# Patient Record
Sex: Female | Born: 1990 | Race: Black or African American | Hispanic: No | Marital: Single | Smoking: Light tobacco smoker
Health system: Southern US, Community
[De-identification: ages and names within clinical notes are randomized; demographics above are authoritative.]

## PROBLEM LIST (undated history)

## (undated) ENCOUNTER — Inpatient Hospital Stay: Payer: Self-pay

## (undated) DIAGNOSIS — O4100X0 Oligohydramnios, unspecified trimester, not applicable or unspecified: Secondary | ICD-10-CM

## (undated) DIAGNOSIS — D649 Anemia, unspecified: Secondary | ICD-10-CM

## (undated) DIAGNOSIS — N390 Urinary tract infection, site not specified: Secondary | ICD-10-CM

## (undated) DIAGNOSIS — D573 Sickle-cell trait: Secondary | ICD-10-CM

## (undated) DIAGNOSIS — K8020 Calculus of gallbladder without cholecystitis without obstruction: Secondary | ICD-10-CM

## (undated) DIAGNOSIS — R102 Pelvic and perineal pain: Secondary | ICD-10-CM

---

## 2013-07-12 ENCOUNTER — Emergency Department: Payer: Self-pay | Admitting: Emergency Medicine

## 2013-07-12 LAB — COMPREHENSIVE METABOLIC PANEL
Albumin: 3.8 g/dL (ref 3.4–5.0)
Alkaline Phosphatase: 51 U/L (ref 50–136)
Anion Gap: 4 — ABNORMAL LOW (ref 7–16)
BUN: 9 mg/dL (ref 7–18)
Bilirubin,Total: 0.2 mg/dL (ref 0.2–1.0)
Calcium, Total: 9.2 mg/dL (ref 8.5–10.1)
Chloride: 108 mmol/L — ABNORMAL HIGH (ref 98–107)
Co2: 28 mmol/L (ref 21–32)
EGFR (Non-African Amer.): >60
Osmolality: 277 (ref 275–301)
Potassium: 3.5 mmol/L (ref 3.5–5.1)
SGPT (ALT): 18 U/L (ref 12–78)
Sodium: 140 mmol/L (ref 136–145)
Total Protein: 7.4 g/dL (ref 6.4–8.2)

## 2013-07-12 LAB — CBC WITH DIFFERENTIAL/PLATELET
Basophil #: 0.1 10*3/uL (ref 0.0–0.1)
Basophil %: 1.1 %
Eosinophil #: 0.2 10*3/uL (ref 0.0–0.7)
Eosinophil %: 4.1 %
HCT: 34.1 % — ABNORMAL LOW (ref 35.0–47.0)
Lymphocyte #: 2.2 10*3/uL (ref 1.0–3.6)
MCHC: 34.9 g/dL (ref 32.0–36.0)
MCV: 83 fL (ref 80–100)
Monocyte #: 0.4 x10 3/mm (ref 0.2–0.9)
Neutrophil %: 44.7 %
Platelet: 197 10*3/uL (ref 150–440)
RBC: 4.1 10*6/uL (ref 3.80–5.20)
RDW: 12.8 % (ref 11.5–14.5)
WBC: 5.1 10*3/uL (ref 3.6–11.0)

## 2013-07-12 LAB — URINALYSIS, COMPLETE
Bilirubin,UR: NEGATIVE
Glucose,UR: NEGATIVE mg/dL (ref 0–75)
Ketone: NEGATIVE
Leukocyte Esterase: NEGATIVE
Protein: NEGATIVE
RBC,UR: 1 /HPF (ref 0–5)
Squamous Epithelial: 6
WBC UR: 1 /HPF (ref 0–5)

## 2013-07-12 LAB — WET PREP, GENITAL

## 2013-07-12 LAB — GC/CHLAMYDIA PROBE AMP

## 2013-07-17 ENCOUNTER — Emergency Department: Payer: Self-pay | Admitting: Emergency Medicine

## 2013-07-23 ENCOUNTER — Emergency Department: Payer: Self-pay | Admitting: Emergency Medicine

## 2013-10-23 ENCOUNTER — Emergency Department: Payer: Self-pay | Admitting: Emergency Medicine

## 2013-10-23 LAB — URINALYSIS, COMPLETE
Bacteria: NONE SEEN
Bilirubin,UR: NEGATIVE
Glucose,UR: NEGATIVE mg/dL (ref 0–75)
Ketone: NEGATIVE
Nitrite: NEGATIVE
Ph: 7 (ref 4.5–8.0)
Protein: NEGATIVE
RBC,UR: 1 /HPF (ref 0–5)
Specific Gravity: 1.015 (ref 1.003–1.030)
WBC UR: 16 /HPF (ref 0–5)

## 2013-10-23 LAB — WET PREP, GENITAL

## 2013-11-30 ENCOUNTER — Emergency Department: Payer: Self-pay | Admitting: Emergency Medicine

## 2013-11-30 LAB — URIC ACID: Uric Acid: 3.6 mg/dL (ref 2.6–6.0)

## 2013-12-06 ENCOUNTER — Emergency Department: Payer: Self-pay | Admitting: Emergency Medicine

## 2014-01-31 ENCOUNTER — Emergency Department: Payer: Self-pay | Admitting: Emergency Medicine

## 2014-01-31 LAB — CBC WITH DIFFERENTIAL/PLATELET
BASOS ABS: 0.1 10*3/uL (ref 0.0–0.1)
Basophil %: 1.1 %
EOS ABS: 0.3 10*3/uL (ref 0.0–0.7)
Eosinophil %: 4.2 %
HCT: 33.3 % — ABNORMAL LOW (ref 35.0–47.0)
HGB: 11 g/dL — ABNORMAL LOW (ref 12.0–16.0)
LYMPHS ABS: 2.8 10*3/uL (ref 1.0–3.6)
LYMPHS PCT: 43.6 %
MCH: 27.7 pg (ref 26.0–34.0)
MCHC: 33 g/dL (ref 32.0–36.0)
MCV: 84 fL (ref 80–100)
MONO ABS: 0.4 x10 3/mm (ref 0.2–0.9)
Monocyte %: 5.8 %
NEUTROS ABS: 2.9 10*3/uL (ref 1.4–6.5)
NEUTROS PCT: 45.3 %
PLATELETS: 189 10*3/uL (ref 150–440)
RBC: 3.97 10*6/uL (ref 3.80–5.20)
RDW: 13.6 % (ref 11.5–14.5)
WBC: 6.5 10*3/uL (ref 3.6–11.0)

## 2014-01-31 LAB — URINALYSIS, COMPLETE
BILIRUBIN, UR: NEGATIVE
GLUCOSE, UR: NEGATIVE mg/dL (ref 0–75)
Ketone: NEGATIVE
NITRITE: NEGATIVE
PH: 5 (ref 4.5–8.0)
Protein: NEGATIVE
RBC,UR: 173 /HPF (ref 0–5)
SPECIFIC GRAVITY: 1.016 (ref 1.003–1.030)
Squamous Epithelial: 3
WBC UR: 8 /HPF (ref 0–5)

## 2014-01-31 LAB — BASIC METABOLIC PANEL
Anion Gap: 6 — ABNORMAL LOW (ref 7–16)
BUN: 8 mg/dL (ref 7–18)
CHLORIDE: 108 mmol/L — AB (ref 98–107)
CO2: 25 mmol/L (ref 21–32)
Calcium, Total: 8.8 mg/dL (ref 8.5–10.1)
Creatinine: 0.64 mg/dL (ref 0.60–1.30)
EGFR (African American): >60
GLUCOSE: 117 mg/dL — AB (ref 65–99)
Osmolality: 277 (ref 275–301)
POTASSIUM: 3.2 mmol/L — AB (ref 3.5–5.1)
SODIUM: 139 mmol/L (ref 136–145)

## 2014-01-31 LAB — TROPONIN I: Troponin-I: <0.02 ng/mL

## 2014-02-01 LAB — GC/CHLAMYDIA PROBE AMP

## 2014-02-01 LAB — TSH: Thyroid Stimulating Horm: 1.75 u[IU]/mL

## 2014-02-01 LAB — WET PREP, GENITAL

## 2014-02-24 ENCOUNTER — Emergency Department: Payer: Self-pay | Admitting: Internal Medicine

## 2014-07-26 ENCOUNTER — Emergency Department: Payer: Self-pay | Admitting: Emergency Medicine

## 2014-07-26 LAB — URINALYSIS, COMPLETE
BLOOD: NEGATIVE
Bacteria: NONE SEEN
Bilirubin,UR: NEGATIVE
Glucose,UR: NEGATIVE mg/dL (ref 0–75)
KETONE: NEGATIVE
Nitrite: NEGATIVE
Ph: 5 (ref 4.5–8.0)
Protein: NEGATIVE
RBC,UR: 8 /HPF (ref 0–5)
Specific Gravity: 1.018 (ref 1.003–1.030)

## 2014-07-26 LAB — COMPREHENSIVE METABOLIC PANEL
ALK PHOS: 42 U/L — AB
ANION GAP: 7 (ref 7–16)
AST: 13 U/L — AB (ref 15–37)
Albumin: 3.7 g/dL (ref 3.4–5.0)
BUN: 7 mg/dL (ref 7–18)
Bilirubin,Total: 0.3 mg/dL (ref 0.2–1.0)
CO2: 25 mmol/L (ref 21–32)
Calcium, Total: 9 mg/dL (ref 8.5–10.1)
Chloride: 109 mmol/L — ABNORMAL HIGH (ref 98–107)
Creatinine: 0.66 mg/dL (ref 0.60–1.30)
EGFR (African American): >60
EGFR (Non-African Amer.): >60
GLUCOSE: 75 mg/dL (ref 65–99)
Osmolality: 278 (ref 275–301)
Potassium: 3.3 mmol/L — ABNORMAL LOW (ref 3.5–5.1)
SGPT (ALT): 17 U/L
Sodium: 141 mmol/L (ref 136–145)
Total Protein: 7.2 g/dL (ref 6.4–8.2)

## 2014-07-26 LAB — CBC WITH DIFFERENTIAL/PLATELET
BASOS ABS: 0.1 10*3/uL (ref 0.0–0.1)
BASOS PCT: 1 %
EOS ABS: 0.2 10*3/uL (ref 0.0–0.7)
EOS PCT: 3.4 %
HCT: 33.7 % — ABNORMAL LOW (ref 35.0–47.0)
HGB: 11.5 g/dL — AB (ref 12.0–16.0)
LYMPHS ABS: 2.3 10*3/uL (ref 1.0–3.6)
Lymphocyte %: 39.5 %
MCH: 28.8 pg (ref 26.0–34.0)
MCHC: 34.1 g/dL (ref 32.0–36.0)
MCV: 85 fL (ref 80–100)
MONO ABS: 0.3 x10 3/mm (ref 0.2–0.9)
MONOS PCT: 5.5 %
NEUTROS ABS: 2.9 10*3/uL (ref 1.4–6.5)
NEUTROS PCT: 50.6 %
Platelet: 189 10*3/uL (ref 150–440)
RBC: 3.98 10*6/uL (ref 3.80–5.20)
RDW: 13.3 % (ref 11.5–14.5)
WBC: 5.8 10*3/uL (ref 3.6–11.0)

## 2014-07-26 LAB — PREGNANCY, URINE: Pregnancy Test, Urine: NEGATIVE m[IU]/mL

## 2014-07-26 LAB — LIPASE, BLOOD: LIPASE: 98 U/L (ref 73–393)

## 2014-07-26 LAB — WET PREP, GENITAL

## 2014-07-26 LAB — GC/CHLAMYDIA PROBE AMP

## 2014-07-28 LAB — URINE CULTURE

## 2014-10-06 ENCOUNTER — Emergency Department: Payer: Self-pay | Admitting: Emergency Medicine

## 2014-10-18 ENCOUNTER — Emergency Department: Payer: Self-pay | Admitting: Emergency Medicine

## 2014-10-18 LAB — CBC WITH DIFFERENTIAL/PLATELET
BASOS ABS: 0 10*3/uL (ref 0.0–0.1)
Basophil %: 0.2 %
Eosinophil #: 0.1 10*3/uL (ref 0.0–0.7)
Eosinophil %: 0.4 %
HCT: 32.3 % — AB (ref 35.0–47.0)
HGB: 10.8 g/dL — ABNORMAL LOW (ref 12.0–16.0)
LYMPHS PCT: 10.8 %
Lymphocyte #: 1.2 10*3/uL (ref 1.0–3.6)
MCH: 28.3 pg (ref 26.0–34.0)
MCHC: 33.4 g/dL (ref 32.0–36.0)
MCV: 85 fL (ref 80–100)
Monocyte #: 0.8 x10 3/mm (ref 0.2–0.9)
Monocyte %: 6.6 %
NEUTROS PCT: 82 %
Neutrophil #: 9.4 10*3/uL — ABNORMAL HIGH (ref 1.4–6.5)
PLATELETS: 198 10*3/uL (ref 150–440)
RBC: 3.81 10*6/uL (ref 3.80–5.20)
RDW: 12.9 % (ref 11.5–14.5)
WBC: 11.5 10*3/uL — ABNORMAL HIGH (ref 3.6–11.0)

## 2014-10-18 LAB — COMPREHENSIVE METABOLIC PANEL
ALBUMIN: 3.4 g/dL (ref 3.4–5.0)
ANION GAP: 9 (ref 7–16)
Alkaline Phosphatase: 66 U/L
BUN: 5 mg/dL — ABNORMAL LOW (ref 7–18)
Bilirubin,Total: 0.6 mg/dL (ref 0.2–1.0)
CALCIUM: 8.5 mg/dL (ref 8.5–10.1)
Chloride: 100 mmol/L (ref 98–107)
Co2: 27 mmol/L (ref 21–32)
Creatinine: 0.73 mg/dL (ref 0.60–1.30)
GLUCOSE: 116 mg/dL — AB (ref 65–99)
OSMOLALITY: 270 (ref 275–301)
Potassium: 3.1 mmol/L — ABNORMAL LOW (ref 3.5–5.1)
SGOT(AST): 27 U/L (ref 15–37)
SGPT (ALT): 38 U/L
SODIUM: 136 mmol/L (ref 136–145)
Total Protein: 7.9 g/dL (ref 6.4–8.2)

## 2014-10-18 LAB — URINALYSIS, COMPLETE
BACTERIA: NONE SEEN
BLOOD: NEGATIVE
Bilirubin,UR: NEGATIVE
Glucose,UR: NEGATIVE mg/dL (ref 0–75)
Nitrite: NEGATIVE
Ph: 5 (ref 4.5–8.0)
RBC,UR: 20 /HPF (ref 0–5)
Specific Gravity: 1.013 (ref 1.003–1.030)
WBC UR: 190 /HPF (ref 0–5)

## 2014-10-18 LAB — GC/CHLAMYDIA PROBE AMP

## 2014-10-18 LAB — WET PREP, GENITAL

## 2014-10-18 LAB — LIPASE, BLOOD: Lipase: 105 U/L (ref 73–393)

## 2014-10-18 LAB — HCG, QUANTITATIVE, PREGNANCY: Beta Hcg, Quant.: <1 m[IU]/mL — ABNORMAL LOW

## 2015-02-02 ENCOUNTER — Emergency Department: Payer: Self-pay | Admitting: Emergency Medicine

## 2015-02-03 ENCOUNTER — Emergency Department: Payer: Self-pay | Admitting: Emergency Medicine

## 2015-04-20 ENCOUNTER — Encounter: Payer: Self-pay | Admitting: *Deleted

## 2015-04-20 ENCOUNTER — Emergency Department
Admission: EM | Admit: 2015-04-20 | Discharge: 2015-04-21 | Disposition: A | Discharge status: Home / Self Care | Payer: Self-pay | Attending: Emergency Medicine | Admitting: Emergency Medicine

## 2015-04-20 ENCOUNTER — Emergency Department: Payer: Self-pay

## 2015-04-20 DIAGNOSIS — F17210 Nicotine dependence, cigarettes, uncomplicated: Secondary | ICD-10-CM | POA: Insufficient documentation

## 2015-04-20 DIAGNOSIS — O9989 Other specified diseases and conditions complicating pregnancy, childbirth and the puerperium: Secondary | ICD-10-CM | POA: Insufficient documentation

## 2015-04-20 DIAGNOSIS — O99331 Smoking (tobacco) complicating pregnancy, first trimester: Secondary | ICD-10-CM | POA: Insufficient documentation

## 2015-04-20 DIAGNOSIS — Z3A01 Less than 8 weeks gestation of pregnancy: Secondary | ICD-10-CM | POA: Insufficient documentation

## 2015-04-20 DIAGNOSIS — Z3491 Encounter for supervision of normal pregnancy, unspecified, first trimester: Secondary | ICD-10-CM

## 2015-04-20 HISTORY — DX: Sickle-cell trait: D57.3

## 2015-04-20 LAB — URINALYSIS COMPLETE WITH MICROSCOPIC (ARMC ONLY)
Bilirubin Urine: NEGATIVE
Glucose, UA: NEGATIVE mg/dL
HGB URINE DIPSTICK: NEGATIVE
Ketones, ur: NEGATIVE mg/dL
Leukocytes, UA: NEGATIVE
NITRITE: NEGATIVE
Protein, ur: NEGATIVE mg/dL
SPECIFIC GRAVITY, URINE: 1.015 (ref 1.005–1.030)
pH: 6 (ref 5.0–8.0)

## 2015-04-20 LAB — COMPREHENSIVE METABOLIC PANEL
ALT: 12 U/L — ABNORMAL LOW (ref 14–54)
ANION GAP: 8 (ref 5–15)
AST: 17 U/L (ref 15–41)
Albumin: 4.4 g/dL (ref 3.5–5.0)
Alkaline Phosphatase: 33 U/L — ABNORMAL LOW (ref 38–126)
BILIRUBIN TOTAL: 0.3 mg/dL (ref 0.3–1.2)
BUN: 7 mg/dL (ref 6–20)
CO2: 23 mmol/L (ref 22–32)
CREATININE: 0.5 mg/dL (ref 0.44–1.00)
Calcium: 9.1 mg/dL (ref 8.9–10.3)
Chloride: 107 mmol/L (ref 101–111)
GFR calc Af Amer: >60 mL/min (ref >60)
GFR calc non Af Amer: >60 mL/min (ref >60)
GLUCOSE: 84 mg/dL (ref 65–99)
Potassium: 3.5 mmol/L (ref 3.5–5.1)
Sodium: 138 mmol/L (ref 135–145)
Total Protein: 7.5 g/dL (ref 6.5–8.1)

## 2015-04-20 LAB — CBC WITH DIFFERENTIAL/PLATELET
BASOS ABS: 0 10*3/uL (ref 0–0.1)
BASOS PCT: 1 %
Eosinophils Absolute: 0.2 10*3/uL (ref 0–0.7)
Eosinophils Relative: 3 %
HCT: 36.7 % (ref 35.0–47.0)
Hemoglobin: 12.3 g/dL (ref 12.0–16.0)
Lymphocytes Relative: 46 %
Lymphs Abs: 2.6 10*3/uL (ref 1.0–3.6)
MCH: 28.8 pg (ref 26.0–34.0)
MCHC: 33.6 g/dL (ref 32.0–36.0)
MCV: 85.7 fL (ref 80.0–100.0)
Monocytes Absolute: 0.3 10*3/uL (ref 0.2–0.9)
Monocytes Relative: 5 %
NEUTROS ABS: 2.5 10*3/uL (ref 1.4–6.5)
Neutrophils Relative %: 45 %
Platelets: 195 10*3/uL (ref 150–440)
RBC: 4.28 MIL/uL (ref 3.80–5.20)
RDW: 12.6 % (ref 11.5–14.5)
WBC: 5.6 10*3/uL (ref 3.6–11.0)

## 2015-04-20 LAB — PREGNANCY, URINE
PREG TEST UR: POSITIVE — AB
PREG TEST UR: POSITIVE — AB

## 2015-04-20 MED ORDER — IOHEXOL 300 MG/ML  SOLN: 100.0000 mL | Freq: Once | INTRAMUSCULAR | Status: AC | PRN

## 2015-04-20 MED ORDER — IOHEXOL 240 MG/ML SOLN
25.0000 mL | Freq: Once | INTRAMUSCULAR | Status: AC | PRN
  Administered 2015-04-20: 25 mL via ORAL

## 2015-04-20 MED ORDER — SODIUM CHLORIDE 0.9 % IV SOLN
Freq: Once | INTRAVENOUS | Status: AC
  Administered 2015-04-20: 22:00:00 via INTRAVENOUS

## 2015-04-20 MED ORDER — ONDANSETRON HCL 4 MG/2ML IJ SOLN
INTRAMUSCULAR | Status: AC
  Administered 2015-04-20: 4 mg via INTRAVENOUS
  Filled 2015-04-20: qty 2

## 2015-04-20 MED ORDER — ONDANSETRON HCL 4 MG/2ML IJ SOLN
4.0000 mg | Freq: Once | INTRAMUSCULAR | Status: AC
  Administered 2015-04-20: 4 mg via INTRAVENOUS

## 2015-04-20 MED ORDER — MORPHINE SULFATE 4 MG/ML IJ SOLN
INTRAMUSCULAR | Status: AC
  Administered 2015-04-20: 4 mg via INTRAVENOUS
  Filled 2015-04-20: qty 1

## 2015-04-20 MED ORDER — MORPHINE SULFATE 4 MG/ML IJ SOLN
4.0000 mg | Freq: Once | INTRAMUSCULAR | Status: AC
  Administered 2015-04-20: 4 mg via INTRAVENOUS

## 2015-04-20 NOTE — ED Notes (Signed)
Pt states she has low abd pain.  Sx began yesterday.  Pt states 1 month ago she was dx with a cyst on her ovary.  Pt denies vag discharge.  No vag bleeding.  Denies dysuria.

## 2015-04-20 NOTE — ED Notes (Signed)
Pt reports two days of lower right abdominal pain. Pt reports she was told she had a cyst on her ovary 2 months ago and the pain feels the same. Denies vaginal discharge or bleeding

## 2015-04-20 NOTE — ED Provider Notes (Signed)
[facility] Emergency Department Provider Note  ____________________________________________  Time seen: Approximately 9:54 PM  I have reviewed the triage vital signs and the nursing notes.   HISTORY  Chief Complaint Abdominal Pain    HPI Jessica Coffey is a 24 y.o. female who complains of right lower quadrant pain starting yesterday afternoon and evening getting worse today patient reports she has a normal appetite slept until noon and ate breakfast and hasn't eaten since then patient reports the pain feels like the pain she had when she had her right-sided ovarian cyst she does not believe she is pregnant patient does not have any vaginal discharge she says she is not nauseated and has not vomited does not have diarrhea not have a fever does not seem to do anything that makes the pain better or worse   Awaiting labs and CT patient is drinking from CT scan at present will sign out the patient to Dr. Manson PasseyBrown who is aware of the patient's status  Past Medical History  Diagnosis Date  . Sickle cell trait     There are no active problems to display for this patient.   History reviewed. No pertinent past surgical history.  No current outpatient prescriptions on file.  Allergies Review of patient's allergies indicates no known allergies.  No family history on file.  Social History History  Substance Use Topics  . Smoking status: Current Every Day Smoker  . Smokeless tobacco: Not on file  . Alcohol Use: No    Review of Systems Eyes: No visual changes. ENT: No sore throat. Cardiovascular: Denies chest pain. Respiratory: Denies shortness of breath. Gastrointestinal: .  No nausea, no vomiting.  No diarrhea.  No constipation. Genitourinary: Negative for dysuria. Musculoskeletal: Negative for back pain. Skin: Negative for rash. Neurological: Negative for headaches, focal weakness or numbness.  10-point ROS otherwise  negative.  ____________________________________________   PHYSICAL EXAM:  VITAL SIGNS: ED Triage Vitals  Enc Vitals Group     BP 04/20/15 1714 114/66 mmHg     Pulse Rate 04/20/15 1714 81     Resp 04/20/15 1714 16     Temp 04/20/15 1714 98.8 F (37.1 C)     Temp Source 04/20/15 1714 Oral     SpO2 04/20/15 1714 98 %     Weight 04/20/15 1714 130 lb (58.968 kg)     Height 04/20/15 1714 5\' 5"  (1.651 m)     Head Cir --      Peak Flow --      Pain Score 04/20/15 1715 10     Pain Loc --      Pain Edu? --      Excl. in GC? --     Constitutional: Alert and oriented. Well appearing and in no acute distress. Eyes: Conjunctivae are normal. PERRL. EOMI. Head: Atraumatic. Nose: No congestion/rhinnorhea. Mouth/Throat: Mucous membranes are moist.  Oropharynx non-erythematous. Neck: No stridor.   Cardiovascular: Normal rate, regular rhythm. Grossly normal heart sounds.  Good peripheral circulation. Respiratory: Normal respiratory effort.  No retractions. Lungs CTAB. Gastrointestinal: Soft tender suprapubically and in the right lower quadrant tenderness is worse in the right lower quadrant and suprapubically patient complains of pain with palpation and percussion and there is rebound tenderness as well No distention. No abdominal bruits. No CVA tenderness. Musculoskeletal: No lower extremity tenderness nor edema.  No joint effusions. Neurologic:  Normal speech and language. No gross focal neurologic deficits are appreciated. Speech is normal. No gait instability. Skin:  Skin is  warm, dry and intact. No rash noted. Psychiatric: Mood and affect are normal. Speech and behavior are normal.  ____________________________________________   LABS (all labs ordered are listed, but only abnormal results are displayed)  Labs Reviewed  COMPREHENSIVE METABOLIC PANEL - Abnormal; Notable for the following:    ALT 12 (*)    Alkaline Phosphatase 33 (*)    All other components within normal limits   CHLAMYDIA/NGC RT PCR ([facility])   CBC WITH DIFFERENTIAL/PLATELET  CBC WITH DIFFERENTIAL/PLATELET  URINALYSIS COMPLETEWITH MICROSCOPIC ([facility])   COMPREHENSIVE METABOLIC PANEL  POC URINE PREG, ED   ____________________________________________  EKG   ____________________________________________  RADIOLOGY  ____________________________________________   PROCEDURES  Procedure(s) performed: None  Critical Care performed: No  ____________________________________________   INITIAL IMPRESSION / ASSESSMENT AND PLAN / ED COURSE  Pertinent labs & imaging results that were available during my care of the patient were reviewed by me and considered in my medical decision making (see chart for details).   ____________________________________________   FINAL CLINICAL IMPRESSION(S) / ED DIAGNOSES  Final diagnoses:  None     Arnaldo NatalPaul F Carletta Feasel, MD 04/20/15 2315

## 2015-04-21 ENCOUNTER — Emergency Department: Payer: Self-pay

## 2015-04-21 LAB — HCG, QUANTITATIVE, PREGNANCY: hCG, Beta Chain, Quant, S: 4728 m[IU]/mL — ABNORMAL HIGH (ref <5)

## 2015-04-21 NOTE — ED Provider Notes (Signed)
Assume care from Dr. Juliette AlcideMelinda for young lady with suprapubic pain. Urine hCG was positive. As such quantitative was done which was fourth greater than 4000. Ultrasound revealed a 5 week intrauterine gestational sac no fetal pole noted at this time. Patient and family notified of all clinical findings patient will be referred to Dr. Dalbert GarnetBeasley outpatient  Darci Currentandolph N Brown, MD 04/21/15 [phone]

## 2015-04-21 NOTE — ED Notes (Signed)
Pt sleeping.  Family at bedside.  Pt waiting on beta blood result.

## 2015-04-21 NOTE — Discharge Instructions (Signed)
First Trimester of Pregnancy The first trimester of pregnancy is from week 1 until the end of week 12 (months 1 through 3). A week after a sperm fertilizes an egg, the egg will implant on the wall of the uterus. This embryo will begin to develop into a baby. Genes from you and your partner are forming the baby. The female genes determine whether the baby is a boy or a girl. At 6-8 weeks, the eyes and face are formed, and the heartbeat can be seen on ultrasound. At the end of 12 weeks, all the baby's organs are formed.  Now that you are pregnant, you will want to do everything you can to have a healthy baby. Two of the most important things are to get good prenatal care and to follow your health care provider's instructions. Prenatal care is all the medical care you receive before the baby's birth. This care will help prevent, find, and treat any problems during the pregnancy and childbirth. BODY CHANGES Your body goes through many changes during pregnancy. The changes vary from woman to woman.   You may gain or lose a couple of pounds at first.  You may feel sick to your stomach (nauseous) and throw up (vomit). If the vomiting is uncontrollable, call your health care provider.  You may tire easily.  You may develop headaches that can be relieved by medicines approved by your health care provider.  You may urinate more often. Painful urination may mean you have a bladder infection.  You may develop heartburn as a result of your pregnancy.  You may develop constipation because certain hormones are causing the muscles that push waste through your intestines to slow down.  You may develop hemorrhoids or swollen, bulging veins (varicose veins).  Your breasts may begin to grow larger and become tender. Your nipples may stick out more, and the tissue that surrounds them (areola) may become darker.  Your gums may bleed and may be sensitive to brushing and flossing.  Dark spots or blotches (chloasma,  mask of pregnancy) may develop on your face. This will likely fade after the baby is born.  Your menstrual periods will stop.  You may have a loss of appetite.  You may develop cravings for certain kinds of food.  You may have changes in your emotions from day to day, such as being excited to be pregnant or being concerned that something may go wrong with the pregnancy and baby.  You may have more vivid and strange dreams.  You may have changes in your hair. These can include thickening of your hair, rapid growth, and changes in texture. Some women also have hair loss during or after pregnancy, or hair that feels dry or thin. Your hair will most likely return to normal after your baby is born. WHAT TO EXPECT AT YOUR PRENATAL VISITS During a routine prenatal visit:  You will be weighed to make sure you and the baby are growing normally.  Your blood pressure will be taken.  Your abdomen will be measured to track your baby's growth.  The fetal heartbeat will be listened to starting around week 10 or 12 of your pregnancy.  Test results from any previous visits will be discussed. Your health care provider may ask you:  How you are feeling.  If you are feeling the baby move.  If you have had any abnormal symptoms, such as leaking fluid, bleeding, severe headaches, or abdominal cramping.  If you have any questions. Other tests   that may be performed during your first trimester include:  Blood tests to find your blood type and to check for the presence of any previous infections. They will also be used to check for low iron levels (anemia) and Rh antibodies. Later in the pregnancy, blood tests for diabetes will be done along with other tests if problems develop.  Urine tests to check for infections, diabetes, or protein in the urine.  An ultrasound to confirm the proper growth and development of the baby.  An amniocentesis to check for possible genetic problems.  Fetal screens for  spina bifida and Down syndrome.  You may need other tests to make sure you and the baby are doing well. HOME CARE INSTRUCTIONS  Medicines  Follow your health care provider's instructions regarding medicine use. Specific medicines may be either safe or unsafe to take during pregnancy.  Take your prenatal vitamins as directed.  If you develop constipation, try taking a stool softener if your health care provider approves. Diet  Eat regular, well-balanced meals. Choose a variety of foods, such as meat or vegetable-based protein, fish, milk and low-fat dairy products, vegetables, fruits, and whole grain breads and cereals. Your health care provider will help you determine the amount of weight gain that is right for you.  Avoid raw meat and uncooked cheese. These carry germs that can cause birth defects in the baby.  Eating four or five small meals rather than three large meals a day may help relieve nausea and vomiting. If you start to feel nauseous, eating a few soda crackers can be helpful. Drinking liquids between meals instead of during meals also seems to help nausea and vomiting.  If you develop constipation, eat more high-fiber foods, such as fresh vegetables or fruit and whole grains. Drink enough fluids to keep your urine clear or pale yellow. Activity and Exercise  Exercise only as directed by your health care provider. Exercising will help you:  Control your weight.  Stay in shape.  Be prepared for labor and delivery.  Experiencing pain or cramping in the lower abdomen or low back is a good sign that you should stop exercising. Check with your health care provider before continuing normal exercises.  Try to avoid standing for long periods of time. Move your legs often if you must stand in one place for a long time.  Avoid heavy lifting.  Wear low-heeled shoes, and practice good posture.  You may continue to have sex unless your health care provider directs you  otherwise. Relief of Pain or Discomfort  Wear a good support bra for breast tenderness.   Take warm sitz baths to soothe any pain or discomfort caused by hemorrhoids. Use hemorrhoid cream if your health care provider approves.   Rest with your legs elevated if you have leg cramps or low back pain.  If you develop varicose veins in your legs, wear support hose. Elevate your feet for 15 minutes, 3-4 times a day. Limit salt in your diet. Prenatal Care  Schedule your prenatal visits by the twelfth week of pregnancy. They are usually scheduled monthly at first, then more often in the last 2 months before delivery.  Write down your questions. Take them to your prenatal visits.  Keep all your prenatal visits as directed by your health care provider. Safety  Wear your seat belt at all times when driving.  Make a list of emergency phone numbers, including numbers for family, friends, the hospital, and police and fire departments. General Tips    Ask your health care provider for a referral to a local prenatal education class. Begin classes no later than at the beginning of month 6 of your pregnancy.  Ask for help if you have counseling or nutritional needs during pregnancy. Your health care provider can offer advice or refer you to specialists for help with various needs.  Do not use hot tubs, steam rooms, or saunas.  Do not douche or use tampons or scented sanitary pads.  Do not cross your legs for long periods of time.  Avoid cat litter boxes and soil used by cats. These carry germs that can cause birth defects in the baby and possibly loss of the fetus by miscarriage or stillbirth.  Avoid all smoking, herbs, alcohol, and medicines not prescribed by your health care provider. Chemicals in these affect the formation and growth of the baby.  Schedule a dentist appointment. At home, brush your teeth with a soft toothbrush and be gentle when you floss. SEEK MEDICAL CARE IF:   You have  dizziness.  You have mild pelvic cramps, pelvic pressure, or nagging pain in the abdominal area.  You have persistent nausea, vomiting, or diarrhea.  You have a bad smelling vaginal discharge.  You have pain with urination.  You notice increased swelling in your face, hands, legs, or ankles. SEEK IMMEDIATE MEDICAL CARE IF:   You have a fever.  You are leaking fluid from your vagina.  You have spotting or bleeding from your vagina.  You have severe abdominal cramping or pain.  You have rapid weight gain or loss.  You vomit blood or material that looks like coffee grounds.  You are exposed to German measles and have never had them.  You are exposed to fifth disease or chickenpox.  You develop a severe headache.  You have shortness of breath.  You have any kind of trauma, such as from a fall or a car accident. Document Released: 11/20/2001 Document Revised: 04/12/2014 Document Reviewed: 10/06/2013 Exit[employer] Patient Information 2015 [employer]. This information is not intended to replace advice given to you by your health care provider. Make sure you discuss any questions you have with your health care provider.  

## 2015-04-21 NOTE — ED Notes (Signed)
Pt aware waiting on lab results.  md in with pt earlier to discuss upreg test result.  Pt resting quietly.  No acute distress.  Skin warm and dry.  Pt took her iv out and placed gauze and tape over site.

## 2015-04-21 NOTE — ED Notes (Signed)
Pt resting comfortably awaiting results, family at bedside.

## 2015-05-03 ENCOUNTER — Encounter: Payer: Self-pay | Admitting: Emergency Medicine

## 2015-05-03 ENCOUNTER — Emergency Department: Payer: Self-pay

## 2015-05-03 ENCOUNTER — Emergency Department
Admission: EM | Admit: 2015-05-03 | Discharge: 2015-05-03 | Disposition: A | Discharge status: Home / Self Care | Payer: Self-pay | Attending: Emergency Medicine | Admitting: Emergency Medicine

## 2015-05-03 DIAGNOSIS — R109 Unspecified abdominal pain: Secondary | ICD-10-CM

## 2015-05-03 DIAGNOSIS — F17210 Nicotine dependence, cigarettes, uncomplicated: Secondary | ICD-10-CM | POA: Insufficient documentation

## 2015-05-03 DIAGNOSIS — Z3A01 Less than 8 weeks gestation of pregnancy: Secondary | ICD-10-CM | POA: Insufficient documentation

## 2015-05-03 DIAGNOSIS — O99331 Smoking (tobacco) complicating pregnancy, first trimester: Secondary | ICD-10-CM | POA: Insufficient documentation

## 2015-05-03 DIAGNOSIS — O9989 Other specified diseases and conditions complicating pregnancy, childbirth and the puerperium: Secondary | ICD-10-CM | POA: Insufficient documentation

## 2015-05-03 DIAGNOSIS — O26899 Other specified pregnancy related conditions, unspecified trimester: Secondary | ICD-10-CM

## 2015-05-03 DIAGNOSIS — R1031 Right lower quadrant pain: Secondary | ICD-10-CM | POA: Insufficient documentation

## 2015-05-03 LAB — URINALYSIS COMPLETE WITH MICROSCOPIC (ARMC ONLY)
BACTERIA UA: NONE SEEN
Bilirubin Urine: NEGATIVE
Glucose, UA: NEGATIVE mg/dL
Hgb urine dipstick: NEGATIVE
Ketones, ur: NEGATIVE mg/dL
Leukocytes, UA: NEGATIVE
Nitrite: NEGATIVE
PH: 7 (ref 5.0–8.0)
Protein, ur: NEGATIVE mg/dL
Specific Gravity, Urine: 1.015 (ref 1.005–1.030)

## 2015-05-03 LAB — COMPREHENSIVE METABOLIC PANEL
ALK PHOS: 35 U/L — AB (ref 38–126)
ALT: 14 U/L (ref 14–54)
ANION GAP: 7 (ref 5–15)
AST: 16 U/L (ref 15–41)
Albumin: 4.2 g/dL (ref 3.5–5.0)
BILIRUBIN TOTAL: 0.5 mg/dL (ref 0.3–1.2)
BUN: 5 mg/dL — ABNORMAL LOW (ref 6–20)
CALCIUM: 9.2 mg/dL (ref 8.9–10.3)
CHLORIDE: 106 mmol/L (ref 101–111)
CO2: 23 mmol/L (ref 22–32)
Creatinine, Ser: 0.44 mg/dL (ref 0.44–1.00)
GFR calc non Af Amer: >60 mL/min (ref >60)
GLUCOSE: 85 mg/dL (ref 65–99)
POTASSIUM: 3.4 mmol/L — AB (ref 3.5–5.1)
Sodium: 136 mmol/L (ref 135–145)
TOTAL PROTEIN: 7.4 g/dL (ref 6.5–8.1)

## 2015-05-03 LAB — HCG, QUANTITATIVE, PREGNANCY: HCG, BETA CHAIN, QUANT, S: 39653 m[IU]/mL — AB (ref <5)

## 2015-05-03 LAB — CBC WITH DIFFERENTIAL/PLATELET
BASOS PCT: 0 %
Basophils Absolute: 0 10*3/uL (ref 0–0.1)
Eosinophils Absolute: 0.2 10*3/uL (ref 0–0.7)
Eosinophils Relative: 4 %
HCT: 32.7 % — ABNORMAL LOW (ref 35.0–47.0)
HEMOGLOBIN: 11 g/dL — AB (ref 12.0–16.0)
Lymphocytes Relative: 33 %
Lymphs Abs: 1.9 10*3/uL (ref 1.0–3.6)
MCH: 28.5 pg (ref 26.0–34.0)
MCHC: 33.5 g/dL (ref 32.0–36.0)
MCV: 84.9 fL (ref 80.0–100.0)
Monocytes Absolute: 0.3 10*3/uL (ref 0.2–0.9)
Monocytes Relative: 5 %
Neutro Abs: 3.2 10*3/uL (ref 1.4–6.5)
Neutrophils Relative %: 58 %
PLATELETS: 172 10*3/uL (ref 150–440)
RBC: 3.85 MIL/uL (ref 3.80–5.20)
RDW: 12.7 % (ref 11.5–14.5)
WBC: 5.6 10*3/uL (ref 3.6–11.0)

## 2015-05-03 MED ORDER — PROMETHAZINE HCL 25 MG PO TABS: 25.0000 mg | ORAL_TABLET | Freq: Four times a day (QID) | ORAL | Status: DC | PRN

## 2015-05-03 MED ORDER — SODIUM CHLORIDE 0.9 % IV BOLUS (SEPSIS)
1000.0000 mL | Freq: Once | INTRAVENOUS | Status: AC
  Administered 2015-05-03: 1000 mL via INTRAVENOUS

## 2015-05-03 NOTE — ED Notes (Signed)
Pt alert and oriented X4, active, cooperative, pt in NAD. RR even and unlabored, color WNL.  Pt informed to return if any life threatening symptoms occur.   

## 2015-05-03 NOTE — Discharge Instructions (Signed)

## 2015-05-03 NOTE — ED Provider Notes (Signed)
[facility] Emergency Department Provider Note  ____________________________________________  Time seen: Approximately 12:40 PM  I have reviewed the triage vital signs and the nursing notes.   HISTORY  Chief Complaint Morning Sickness    HPI Jessica Coffey is a 24 y.o. female who complains of right lower quadrant abdominal pain since yesterday. States that she vomited all night long last night unable to keep any fluids or food down. Patient was seen here on May 11 diagnosed as pregnant. Abdominal ultrasound at that time demonstrated a [redacted] week gestational pregnancy. She denies any vaginal discharge or diarrhea at this time. No fever or chills or pain is 10 over 10. Patient is a gravida 2 para 1 AB 0. LMP 4:15 2016.  Past Medical History  Diagnosis Date  . Sickle cell trait     There are no active problems to display for this patient.   History reviewed. No pertinent past surgical history.  Current Outpatient Rx  Name  Route  Sig  Dispense  Refill  . promethazine (PHENERGAN) 25 MG tablet   Oral   Take 1 tablet (25 mg total) by mouth every 6 (six) hours as needed for nausea or vomiting.   30 tablet   0     Allergies Review of patient's allergies indicates no known allergies.  History reviewed. No pertinent family history.  Social History History  Substance Use Topics  . Smoking status: Current Every Day Smoker  . Smokeless tobacco: Not on file  . Alcohol Use: No    Review of Systems Constitutional: No fever/chills ENT: No sore throat. Cardiovascular: Denies chest pain. Respiratory: Denies shortness of breath. Gastrointestinal: Positive right lower quadrant abdominal pain associated with nausea and vomiting..  No diarrhea.  No constipation. Genitourinary: Negative for dysuria. Musculoskeletal: Negative for back pain. Skin: Negative for rash. Neurological: Negative for headaches, focal weakness or numbness.  10-point ROS otherwise  negative.  ____________________________________________   PHYSICAL EXAM:  VITAL SIGNS: ED Triage Vitals  Enc Vitals Group     BP 05/03/15 1156 117/66 mmHg     Pulse Rate 05/03/15 1156 79     Resp 05/03/15 1156 18     Temp 05/03/15 1156 98.1 F (36.7 C)     Temp Source 05/03/15 1156 Oral     SpO2 05/03/15 1156 100 %     Weight 05/03/15 1156 140 lb (63.504 kg)     Height 05/03/15 1156  (1.651 m)     Head Cir --      Peak Flow --      Pain Score 05/03/15 1157 0     Pain Loc --      Pain Edu? --      Excl. in GC? --     Constitutional: Alert and oriented. Well appearing and in no acute distress. Cardiovascular: Normal rate, regular rhythm. Grossly normal heart sounds.  Good peripheral circulation. Respiratory: Normal respiratory effort.  No retractions. Lungs CTAB. Gastrointestinal: Soft and under both suprapubically and in the right lower quadrant. With the right lower quadrant worse. There is rebound tenderness as well. No distention no abdominal bruits. No CVA tenderness. Genitourinary: Exam deferred Musculoskeletal: No lower extremity tenderness nor edema.  No joint effusions. Neurologic:  Normal speech and language. No gross focal neurologic deficits are appreciated. Speech is normal. No gait instability. Skin:  Skin is warm, dry and intact. No rash noted. Psychiatric: Mood and affect are normal. Speech and behavior are normal.  ____________________________________________   LABS (all  labs ordered are listed, but only abnormal results are displayed)  Labs Reviewed  COMPREHENSIVE METABOLIC PANEL - Abnormal; Notable for the following:    Potassium 3.4 (*)    BUN 5 (*)    Alkaline Phosphatase 35 (*)    All other components within normal limits  CBC WITH DIFFERENTIAL/PLATELET - Abnormal; Notable for the following:    Hemoglobin 11.0 (*)    HCT 32.7 (*)    All other components within normal limits  URINALYSIS COMPLETEWITH MICROSCOPIC ([facility])  - Abnormal; Notable  for the following:    Color, Urine YELLOW (*)    APPearance CLEAR (*)    Squamous Epithelial / LPF 0-5 (*)    All other components within normal limits  HCG, QUANTITATIVE, PREGNANCY - Abnormal; Notable for the following:    hCG, Beta Chain, Quant, [place] [id number] (*)    All other components within normal limits   ____________________________________________  EKG  None ____________________________________________  RADIOLOGY  Transvaginal and facial CT pelvic CT negative. Live viable pregnancy noted at approximately 6 weeks 6 days. No active bleeding. ____________________________________________   PROCEDURES  Procedure(s) performed: None  Critical Care performed: No  ____________________________________________   INITIAL IMPRESSION / ASSESSMENT AND PLAN / ED COURSE  Pertinent labs & imaging results that were available during my care of the patient were reviewed by me and considered in my medical decision making (see chart for details).  Patient was infused with 2000 cc of normal saline. Feels better nausea is diminished. Patient was discharged to home with follow-up with Dr. Tiburcio PeaHarris at [facility]. Rx given for Phenergan 25 mg. All lab work was reviewed and discussed with the patient. She will return to the ER if symptoms worsen. ____________________________________________   FINAL CLINICAL IMPRESSION(S) / ED DIAGNOSES  Final diagnoses:  Abdominal pain affecting pregnancy      Evangeline DakinCharles M Beers, PA-C 05/03/15 1545  [address]Charles M Beers, PA-C 05/03/15 1624  Phineas SemenGraydon Goodman, MD 05/04/15 [phone]

## 2015-05-03 NOTE — ED Notes (Signed)
Last pm developed abd pain and vomiting, states she is preg aroundd 7 wks, no prenatal care

## 2015-05-03 NOTE — ED Notes (Signed)
States she is 7 weeks preg and unable to keep anything down. Positive n/v

## 2015-05-08 ENCOUNTER — Encounter: Payer: Self-pay | Admitting: Emergency Medicine

## 2015-05-08 ENCOUNTER — Emergency Department
Admission: EM | Admit: 2015-05-08 | Discharge: 2015-05-08 | Disposition: A | Discharge status: Home / Self Care | Payer: Self-pay | Attending: Student | Admitting: Student

## 2015-05-08 DIAGNOSIS — N939 Abnormal uterine and vaginal bleeding, unspecified: Secondary | ICD-10-CM

## 2015-05-08 DIAGNOSIS — O200 Threatened abortion: Secondary | ICD-10-CM | POA: Insufficient documentation

## 2015-05-08 DIAGNOSIS — R110 Nausea: Secondary | ICD-10-CM | POA: Insufficient documentation

## 2015-05-08 DIAGNOSIS — Z3A01 Less than 8 weeks gestation of pregnancy: Secondary | ICD-10-CM | POA: Insufficient documentation

## 2015-05-08 DIAGNOSIS — O9989 Other specified diseases and conditions complicating pregnancy, childbirth and the puerperium: Secondary | ICD-10-CM | POA: Insufficient documentation

## 2015-05-08 LAB — COMPREHENSIVE METABOLIC PANEL
ALT: 20 U/L (ref 14–54)
ANION GAP: 9 (ref 5–15)
AST: 25 U/L (ref 15–41)
Albumin: 4.1 g/dL (ref 3.5–5.0)
Alkaline Phosphatase: 35 U/L — ABNORMAL LOW (ref 38–126)
BUN: 9 mg/dL (ref 6–20)
CO2: 22 mmol/L (ref 22–32)
Calcium: 8.9 mg/dL (ref 8.9–10.3)
Chloride: 105 mmol/L (ref 101–111)
Creatinine, Ser: 0.54 mg/dL (ref 0.44–1.00)
Glucose, Bld: 101 mg/dL — ABNORMAL HIGH (ref 65–99)
Potassium: 3.2 mmol/L — ABNORMAL LOW (ref 3.5–5.1)
Sodium: 136 mmol/L (ref 135–145)
Total Bilirubin: 0.4 mg/dL (ref 0.3–1.2)
Total Protein: 7.2 g/dL (ref 6.5–8.1)

## 2015-05-08 LAB — ABO/RH
ABO/RH(D): O POS
ABO/RH(D): O POS

## 2015-05-08 LAB — HCG, QUANTITATIVE, PREGNANCY: hCG, Beta Chain, Quant, S: 59045 m[IU]/mL — ABNORMAL HIGH (ref <5)

## 2015-05-08 LAB — CBC
HEMATOCRIT: 32 % — AB (ref 35.0–47.0)
HEMOGLOBIN: 10.9 g/dL — AB (ref 12.0–16.0)
MCH: 29 pg (ref 26.0–34.0)
MCHC: 34.2 g/dL (ref 32.0–36.0)
MCV: 85 fL (ref 80.0–100.0)
Platelets: 178 10*3/uL (ref 150–440)
RBC: 3.77 MIL/uL — ABNORMAL LOW (ref 3.80–5.20)
RDW: 12.7 % (ref 11.5–14.5)
WBC: 5.8 10*3/uL (ref 3.6–11.0)

## 2015-05-08 NOTE — ED Provider Notes (Signed)
[facility] Emergency Department Provider Note  ____________________________________________  Time seen: Approximately 9:29 AM  I have reviewed the triage vital signs and the nursing notes.   HISTORY  Chief Complaint Vaginal Bleeding    HPI Jessica Coffey is a 24 y.o. female with history of sickle cell trait, G2 P1 pregnant with single intrauterine pregnancy at approximately [redacted] weeks gestation confirmed by ultrasound on 05/03/2015 who presents for evaluation of abdominal pain and spotting. The patient was seen here on 05/03/2015 for abdominal pain. That abdominal pain resolved however this morning she has had sharp abdominal pains and light vaginal spotting not enough to fill a pad. No vomiting or diarrhea. She has been nauseated. No fevers or chills. No abnormal vaginal discharge. No Modifying factors. Current severity is mild and the bleeding has been constant but has been tapering off.   Past Medical History  Diagnosis Date  . Sickle cell trait     There are no active problems to display for this patient.   History reviewed. No pertinent past surgical history.  Current Outpatient Rx  Name  Route  Sig  Dispense  Refill  . promethazine (PHENERGAN) 25 MG tablet   Oral   Take 1 tablet (25 mg total) by mouth every 6 (six) hours as needed for nausea or vomiting.   30 tablet   0     Allergies Review of patient's allergies indicates no known allergies.  No family history on file.  Social History History  Substance Use Topics  . Smoking status: Never Smoker   . Smokeless tobacco: Not on file  . Alcohol Use: No    Review of Systems Constitutional: No fever/chills Eyes: No visual changes. ENT: No sore throat. Cardiovascular: Denies chest pain. Respiratory: Denies shortness of breath. Gastrointestinal: + abdominal pain.  + nausea, no vomiting.  No diarrhea.  No constipation. Genitourinary: Negative for dysuria. Musculoskeletal: Negative for back  pain. Skin: Negative for rash. Neurological: Negative for headaches, focal weakness or numbness.  10-point ROS otherwise negative.  ____________________________________________   PHYSICAL EXAM:  VITAL SIGNS: ED Triage Vitals  Enc Vitals Group     BP 05/08/15 0912 103/67 mmHg     Pulse Rate 05/08/15 0912 75     Resp 05/08/15 0912 20     Temp 05/08/15 0912 98.2 F (36.8 C)     Temp Source 05/08/15 0912 Oral     SpO2 05/08/15 0912 98 %     Weight 05/08/15 0912 136 lb 2 oz (61.746 kg)     Height 05/08/15 0912 5' (1.524 m)     Head Cir --      Peak Flow --      Pain Score 05/08/15 0916 10     Pain Loc --      Pain Edu? --      Excl. in GC? --     Constitutional: Alert and oriented. Well appearing and in no acute distress. Eyes: Conjunctivae are normal. PERRL. EOMI. Head: Atraumatic. Nose: No congestion/rhinnorhea. Mouth/Throat: Mucous membranes are moist.  Oropharynx non-erythematous. Neck: No stridor.  Cardiovascular: Normal rate, regular rhythm. Grossly normal heart sounds.  Good peripheral circulation. Respiratory: Normal respiratory effort.  No retractions. Lungs CTAB. Gastrointestinal: Soft and nontender. No distention. No abdominal bruits. No CVA tenderness. Pelvic: Very small less than 0.5 cm blood clot in the vaginal vault, no active bleeding from a closed os Musculoskeletal: No lower extremity tenderness nor edema.  No joint effusions. Neurologic:  Normal speech and language. No  gross focal neurologic deficits are appreciated. Speech is normal. No gait instability. Skin:  Skin is warm, dry and intact. No rash noted. Psychiatric: Mood and affect are normal. Speech and behavior are normal.  ____________________________________________   LABS (all labs ordered are listed, but only abnormal results are displayed)  Labs Reviewed  CBC - Abnormal; Notable for the following:    RBC 3.77 (*)    Hemoglobin 10.9 (*)    HCT 32.0 (*)    All other components within  normal limits  COMPREHENSIVE METABOLIC PANEL - Abnormal; Notable for the following:    Potassium 3.2 (*)    Glucose, Bld 101 (*)    Alkaline Phosphatase 35 (*)    All other components within normal limits  HCG, QUANTITATIVE, PREGNANCY  ABO/RH  ABO/RH   ____________________________________________  EKG  none ____________________________________________  RADIOLOGY  none ____________________________________________   PROCEDURES  Procedure(s) performed: None  Critical Care performed: No  ____________________________________________   INITIAL IMPRESSION / ASSESSMENT AND PLAN / ED COURSE  Pertinent labs & imaging results that were available during my care of the patient were reviewed by me and considered in my medical decision making (see chart for details).  Jessica Coffey is a 24 y.o. female with history of sickle cell trait, G2 P1 pregnant with single intrauterine pregnancy at approximately [redacted] weeks gestation confirmed by ultrasound on 05/03/2015 who presents for evaluation of abdominal pain and spotting. Currently she has no active bleeding on exam. Vital signs are stable, she is afebrile. Hemoglobin is stable when compared to prior. Labs are otherwise at baseline. O+ blood type, no need for rope damp. Discussed threatened miscarriage, return precautions and she will follow-up with the health department in the coming week. She is comfortable with discharge plan. BhCG pending but results will not affect management in the ER so will discharge home. ____________________________________________   FINAL CLINICAL IMPRESSION(S) / ED DIAGNOSES  Final diagnoses:  Threatened abortion in early pregnancy  Vaginal bleeding      Jessica DossEryka A Jazon Jipson, MD 05/08/15 1051

## 2015-05-08 NOTE — ED Notes (Signed)
Pt reports being [redacted] weeks pregnant with bleeding and abd pain that started this am.

## 2015-06-03 ENCOUNTER — Encounter: Payer: Self-pay | Admitting: Emergency Medicine

## 2015-06-03 DIAGNOSIS — O9989 Other specified diseases and conditions complicating pregnancy, childbirth and the puerperium: Secondary | ICD-10-CM | POA: Insufficient documentation

## 2015-06-03 DIAGNOSIS — R1030 Lower abdominal pain, unspecified: Secondary | ICD-10-CM | POA: Insufficient documentation

## 2015-06-03 DIAGNOSIS — Z3A11 11 weeks gestation of pregnancy: Secondary | ICD-10-CM | POA: Insufficient documentation

## 2015-06-03 LAB — COMPREHENSIVE METABOLIC PANEL
ALT: 23 U/L (ref 14–54)
ANION GAP: 6 (ref 5–15)
AST: 26 U/L (ref 15–41)
Albumin: 4.7 g/dL (ref 3.5–5.0)
Alkaline Phosphatase: 35 U/L — ABNORMAL LOW (ref 38–126)
BUN: 8 mg/dL (ref 6–20)
CALCIUM: 9.6 mg/dL (ref 8.9–10.3)
CO2: 26 mmol/L (ref 22–32)
CREATININE: 0.49 mg/dL (ref 0.44–1.00)
Chloride: 102 mmol/L (ref 101–111)
GFR calc non Af Amer: >60 mL/min (ref >60)
GLUCOSE: 79 mg/dL (ref 65–99)
Potassium: 3.2 mmol/L — ABNORMAL LOW (ref 3.5–5.1)
SODIUM: 134 mmol/L — AB (ref 135–145)
Total Bilirubin: 0.4 mg/dL (ref 0.3–1.2)
Total Protein: 8.4 g/dL — ABNORMAL HIGH (ref 6.5–8.1)

## 2015-06-03 LAB — CBC WITH DIFFERENTIAL/PLATELET
BASOS PCT: 1 %
Basophils Absolute: 0.1 10*3/uL (ref 0–0.1)
EOS ABS: 0.2 10*3/uL (ref 0–0.7)
Eosinophils Relative: 2 %
HCT: 34.5 % — ABNORMAL LOW (ref 35.0–47.0)
Hemoglobin: 11.7 g/dL — ABNORMAL LOW (ref 12.0–16.0)
Lymphocytes Relative: 32 %
Lymphs Abs: 2.3 10*3/uL (ref 1.0–3.6)
MCH: 29.2 pg (ref 26.0–34.0)
MCHC: 34 g/dL (ref 32.0–36.0)
MCV: 85.9 fL (ref 80.0–100.0)
Monocytes Absolute: 0.4 10*3/uL (ref 0.2–0.9)
Monocytes Relative: 5 %
NEUTROS PCT: 60 %
Neutro Abs: 4.4 10*3/uL (ref 1.4–6.5)
PLATELETS: 210 10*3/uL (ref 150–440)
RBC: 4.02 MIL/uL (ref 3.80–5.20)
RDW: 12.9 % (ref 11.5–14.5)
WBC: 7.3 10*3/uL (ref 3.6–11.0)

## 2015-06-03 LAB — POCT PREGNANCY, URINE: Preg Test, Ur: POSITIVE — AB

## 2015-06-03 LAB — URINALYSIS COMPLETE WITH MICROSCOPIC (ARMC ONLY)
BACTERIA UA: NONE SEEN
Bilirubin Urine: NEGATIVE
GLUCOSE, UA: NEGATIVE mg/dL
Hgb urine dipstick: NEGATIVE
Leukocytes, UA: NEGATIVE
Nitrite: NEGATIVE
Protein, ur: NEGATIVE mg/dL
SPECIFIC GRAVITY, URINE: 1.02 (ref 1.005–1.030)
pH: 5 (ref 5.0–8.0)

## 2015-06-03 NOTE — ED Notes (Addendum)
Patient states that she developed sharpe lower abd pain that started this morning and has become worse throughout the day. Patient denies any nausea, vomiting or urinary symptoms. Patient states that she is about [redacted] weeks pregnant.

## 2015-06-04 ENCOUNTER — Emergency Department
Admission: EM | Admit: 2015-06-04 | Discharge: 2015-06-05 | Disposition: A | Discharge status: Home / Self Care | Payer: Self-pay | Attending: Emergency Medicine | Admitting: Emergency Medicine

## 2015-06-04 ENCOUNTER — Emergency Department
Admission: EM | Admit: 2015-06-04 | Discharge: 2015-06-04 | Discharge status: Against Medical Advice | Payer: Self-pay | Attending: Emergency Medicine | Admitting: Emergency Medicine

## 2015-06-04 ENCOUNTER — Encounter: Payer: Self-pay | Admitting: Emergency Medicine

## 2015-06-04 DIAGNOSIS — Z3A00 Weeks of gestation of pregnancy not specified: Secondary | ICD-10-CM | POA: Insufficient documentation

## 2015-06-04 DIAGNOSIS — O210 Mild hyperemesis gravidarum: Secondary | ICD-10-CM | POA: Insufficient documentation

## 2015-06-04 LAB — CBC WITH DIFFERENTIAL/PLATELET
BASOS PCT: 1 %
Basophils Absolute: 0.1 10*3/uL (ref 0–0.1)
EOS ABS: 0.1 10*3/uL (ref 0–0.7)
EOS PCT: 2 %
HCT: 32.2 % — ABNORMAL LOW (ref 35.0–47.0)
Hemoglobin: 11.1 g/dL — ABNORMAL LOW (ref 12.0–16.0)
LYMPHS ABS: 2.1 10*3/uL (ref 1.0–3.6)
Lymphocytes Relative: 32 %
MCH: 29.3 pg (ref 26.0–34.0)
MCHC: 34.5 g/dL (ref 32.0–36.0)
MCV: 84.8 fL (ref 80.0–100.0)
MONO ABS: 0.3 10*3/uL (ref 0.2–0.9)
Monocytes Relative: 5 %
NEUTROS ABS: 3.9 10*3/uL (ref 1.4–6.5)
Neutrophils Relative %: 60 %
Platelets: 180 10*3/uL (ref 150–440)
RBC: 3.79 MIL/uL — AB (ref 3.80–5.20)
RDW: 12.5 % (ref 11.5–14.5)
WBC: 6.5 10*3/uL (ref 3.6–11.0)

## 2015-06-04 LAB — COMPREHENSIVE METABOLIC PANEL
ALT: 24 U/L (ref 14–54)
AST: 26 U/L (ref 15–41)
Albumin: 4.3 g/dL (ref 3.5–5.0)
Alkaline Phosphatase: 30 U/L — ABNORMAL LOW (ref 38–126)
Anion gap: 8 (ref 5–15)
BILIRUBIN TOTAL: 0.6 mg/dL (ref 0.3–1.2)
BUN: 7 mg/dL (ref 6–20)
CALCIUM: 9.4 mg/dL (ref 8.9–10.3)
CHLORIDE: 103 mmol/L (ref 101–111)
CO2: 24 mmol/L (ref 22–32)
CREATININE: 0.36 mg/dL — AB (ref 0.44–1.00)
GFR calc Af Amer: >60 mL/min (ref >60)
GFR calc non Af Amer: >60 mL/min (ref >60)
Glucose, Bld: 80 mg/dL (ref 65–99)
Potassium: 3.3 mmol/L — ABNORMAL LOW (ref 3.5–5.1)
Sodium: 135 mmol/L (ref 135–145)
TOTAL PROTEIN: 7.5 g/dL (ref 6.5–8.1)

## 2015-06-04 LAB — HCG, QUANTITATIVE, PREGNANCY: HCG, BETA CHAIN, QUANT, S: 73833 m[IU]/mL — AB (ref <5)

## 2015-06-04 LAB — URINALYSIS COMPLETE WITH MICROSCOPIC (ARMC ONLY)
BILIRUBIN URINE: NEGATIVE
Bacteria, UA: NONE SEEN
Glucose, UA: >500 mg/dL — AB
HGB URINE DIPSTICK: NEGATIVE
Leukocytes, UA: NEGATIVE
Nitrite: NEGATIVE
Protein, ur: NEGATIVE mg/dL
Specific Gravity, Urine: 1.02 (ref 1.005–1.030)
pH: 6 (ref 5.0–8.0)

## 2015-06-04 MED ORDER — ONDANSETRON HCL 4 MG/2ML IJ SOLN
INTRAMUSCULAR | Status: AC
  Administered 2015-06-04: 4 mg via INTRAVENOUS
  Filled 2015-06-04: qty 2

## 2015-06-04 MED ORDER — ONDANSETRON HCL 4 MG/2ML IJ SOLN
4.0000 mg | Freq: Once | INTRAMUSCULAR | Status: AC
  Administered 2015-06-04: 4 mg via INTRAVENOUS

## 2015-06-04 MED ORDER — ONDANSETRON HCL 4 MG PO TABS: 4.0000 mg | ORAL_TABLET | Freq: Every day | ORAL | Status: DC | PRN

## 2015-06-04 MED ORDER — DEXTROSE-NACL 5-0.9 % IV SOLN: INTRAVENOUS | Status: DC

## 2015-06-04 MED ORDER — DEXTROSE 5 % AND 0.9 % NACL IV BOLUS
1000.0000 mL | Freq: Once | INTRAVENOUS | Status: AC
  Administered 2015-06-04: 1000 mL via INTRAVENOUS
  Filled 2015-06-04: qty 1000

## 2015-06-04 NOTE — ED Provider Notes (Addendum)
[facility] Emergency Department Provider Note     Time seen: ----------------------------------------- 8:45 PM on 06/04/2015 -----------------------------------------    I have reviewed the triage vital signs and the nursing notes.   HISTORY  Chief Complaint Emesis    HPI Jessica Coffey is a 24 y.o. female who presents ER for 2 days of vomiting and inability to keep anything down. She is G 2 P1 AB 0. Patient states her prior pregnancy she did not have any nausea vomiting at all. She has had some lower abdominal pain, no bleeding or leakage of fluid. She is complains severe vomiting that is worse with eating.   Past Medical History  Diagnosis Date  . Sickle cell trait     There are no active problems to display for this patient.   History reviewed. No pertinent past surgical history.  Allergies Review of patient's allergies indicates no known allergies.  Social History History  Substance Use Topics  . Smoking status: Never Smoker   . Smokeless tobacco: Not on file  . Alcohol Use: No    Review of Systems Constitutional: Negative for fever. Eyes: Negative for visual changes. ENT: Negative for sore throat. Cardiovascular: Negative for chest pain. Respiratory: Negative for shortness of breath. Gastrointestinal: Negative for abdominal pain, vomiting and diarrhea. Genitourinary: Negative for dysuria. Musculoskeletal: Negative for back pain. Skin: Negative for rash. Neurological: Negative for headaches, focal weakness or numbness.  10-point ROS otherwise negative.  ____________________________________________   PHYSICAL EXAM:  VITAL SIGNS: ED Triage Vitals  Enc Vitals Group     BP 06/04/15 2032 127/114 mmHg     Pulse Rate 06/04/15 2032 105     Resp 06/04/15 2032 20     Temp --      Temp src --      SpO2 06/04/15 2032 98 %     Weight 06/04/15 2032 138 lb (62.596 kg)     Height 06/04/15 2032 5\' 5"  (1.651 m)     Head Cir --       Peak Flow --      Pain Score 06/04/15 2033 10     Pain Loc --      Pain Edu? --      Excl. in GC? --     Constitutional: Alert and oriented. Well appearing and in no distress. Eyes: Conjunctivae are normal. PERRL. Normal extraocular movements. ENT   Head: Normocephalic and atraumatic.   Nose: No congestion/rhinnorhea.   Mouth/Throat: Mucous membranes are moist.   Neck: No stridor. Hematological/Lymphatic/Immunilogical: No cervical lymphadenopathy. Cardiovascular: Normal rate, regular rhythm. Normal and symmetric distal pulses are present in all extremities. No murmurs, rubs, or gallops. Respiratory: Normal respiratory effort without tachypnea nor retractions. Breath sounds are clear and equal bilaterally. No wheezes/rales/rhonchi. Gastrointestinal: Soft and nontender. No distention. No abdominal bruits. There is no CVA tenderness. Musculoskeletal: Nontender with normal range of motion in all extremities. No joint effusions.  No lower extremity tenderness nor edema. Neurologic:  Normal speech and language. No gross focal neurologic deficits are appreciated. Speech is normal. No gait instability. Skin:  Skin is warm, dry and intact. No rash noted. Psychiatric: Mood and affect are normal. Speech and behavior are normal. Patient exhibits appropriate insight and judgment. ____________________________________________  ED COURSE:  Pertinent labs & imaging results that were available during my care of the patient were reviewed by me and considered in my medical decision making (see chart for details). Patient being given D5 normal saline IV fluid bolus, IV Zofran. We'll  check basic labs and reevaluate. ____________________________________________    LABS (pertinent positives/negatives)  Labs Reviewed  CBC WITH DIFFERENTIAL/PLATELET - Abnormal; Notable for the following:    RBC 3.79 (*)    Hemoglobin 11.1 (*)    HCT 32.2 (*)    All other components within normal limits   COMPREHENSIVE METABOLIC PANEL - Abnormal; Notable for the following:    Potassium 3.3 (*)    Creatinine, Ser 0.36 (*)    Alkaline Phosphatase 30 (*)    All other components within normal limits  URINALYSIS COMPLETEWITH MICROSCOPIC ([facility] ONLY)  HCG, QUANTITATIVE, PREGNANCY    RADIOLOGY None  ____________________________________________  FINAL ASSESSMENT AND PLAN  Hyperemesis gravidarum  Plan: Patient feeling better after fluids and Zofran, will continue home on Zofran. She can return for worsening or worrisome symptoms.   Emily Filbert, MD   Emily Filbert, MD 06/04/15 1610  Emily Filbert, MD 06/04/15 [phone]

## 2015-06-04 NOTE — Discharge Instructions (Signed)
Hyperemesis Gravidarum °Hyperemesis gravidarum is a severe form of nausea and vomiting that happens during pregnancy. Hyperemesis is worse than morning sickness. It may cause you to have nausea or vomiting all day for many days. It may keep you from eating and drinking enough food and liquids. Hyperemesis usually occurs during the first half (the first 20 weeks) of pregnancy. It often goes away once a woman is in her second half of pregnancy. However, sometimes hyperemesis continues through an entire pregnancy.  °CAUSES  °The cause of this condition is not completely known but is thought to be related to changes in the body's hormones when pregnant. It could be from the high level of the pregnancy hormone or an increase in estrogen in the body.  °SIGNS AND SYMPTOMS  °· Severe nausea and vomiting. °· Nausea that does not go away. °· Vomiting that does not allow you to keep any food down. °· Weight loss and body fluid loss (dehydration). °· Having no desire to eat or not liking food you have previously enjoyed. °DIAGNOSIS  °Your health care provider will do a physical exam and ask you about your symptoms. He or she may also order blood tests and urine tests to make sure something else is not causing the problem.  °TREATMENT  °You may only need medicine to control the problem. If medicines do not control the nausea and vomiting, you will be treated in the hospital to prevent dehydration, increased acid in the blood (acidosis), weight loss, and changes in the electrolytes in your body that may harm the unborn baby (fetus). You may need IV fluids.  °HOME CARE INSTRUCTIONS  °· Only take over-the-counter or prescription medicines as directed by your health care provider. °· Try eating a couple of dry crackers or toast in the morning before getting out of bed. °· Avoid foods and smells that upset your stomach. °· Avoid fatty and spicy foods. °· Eat 5-6 small meals a day. °· Do not drink when eating meals. Drink between  meals. °· For snacks, eat high-protein foods, such as cheese. °· Eat or suck on things that have ginger in them. Ginger helps nausea. °· Avoid food preparation. The smell of food can spoil your appetite. °· Avoid iron pills and iron in your multivitamins until after 3-4 months of being pregnant. However, consult with your health care provider before stopping any prescribed iron pills. °SEEK MEDICAL CARE IF:  °· Your abdominal pain increases. °· You have a severe headache. °· You have vision problems. °· You are losing weight. °SEEK IMMEDIATE MEDICAL CARE IF:  °· You are unable to keep fluids down. °· You vomit blood. °· You have constant nausea and vomiting. °· You have excessive weakness. °· You have extreme thirst. °· You have dizziness or fainting. °· You have a fever or persistent symptoms for more than 2-3 days. °· You have a fever and your symptoms suddenly get worse. °MAKE SURE YOU:  °· Understand these instructions. °· Will watch your condition. °· Will get help right away if you are not doing well or get worse. °Document Released: 11/26/2005 Document Revised: 09/16/2013 Document Reviewed: 07/08/2013 °[employer]® Patient Information ©2015 [employer]. This information is not intended to replace advice given to you by your health care provider. Make sure you discuss any questions you have with your health care provider. ° °

## 2015-06-04 NOTE — ED Notes (Signed)
Called from lobby with no reply

## 2015-06-04 NOTE — ED Notes (Signed)
Pt presents to ER stating vomiting for 2 days.

## 2015-06-06 ENCOUNTER — Emergency Department
Admission: EM | Admit: 2015-06-06 | Discharge: 2015-06-06 | Disposition: A | Discharge status: Home / Self Care | Payer: Self-pay | Attending: Emergency Medicine | Admitting: Emergency Medicine

## 2015-06-06 ENCOUNTER — Emergency Department: Payer: Self-pay

## 2015-06-06 ENCOUNTER — Encounter: Payer: Self-pay | Admitting: *Deleted

## 2015-06-06 DIAGNOSIS — O26899 Other specified pregnancy related conditions, unspecified trimester: Secondary | ICD-10-CM

## 2015-06-06 DIAGNOSIS — O210 Mild hyperemesis gravidarum: Secondary | ICD-10-CM | POA: Insufficient documentation

## 2015-06-06 DIAGNOSIS — Z3A11 11 weeks gestation of pregnancy: Secondary | ICD-10-CM | POA: Insufficient documentation

## 2015-06-06 DIAGNOSIS — R109 Unspecified abdominal pain: Secondary | ICD-10-CM | POA: Insufficient documentation

## 2015-06-06 LAB — CBC WITH DIFFERENTIAL/PLATELET
Basophils Absolute: 0 10*3/uL (ref 0–0.1)
Basophils Relative: 1 %
EOS PCT: 3 %
Eosinophils Absolute: 0.1 10*3/uL (ref 0–0.7)
HEMATOCRIT: 31.5 % — AB (ref 35.0–47.0)
Hemoglobin: 10.7 g/dL — ABNORMAL LOW (ref 12.0–16.0)
LYMPHS ABS: 1.6 10*3/uL (ref 1.0–3.6)
LYMPHS PCT: 31 %
MCH: 29.3 pg (ref 26.0–34.0)
MCHC: 34.1 g/dL (ref 32.0–36.0)
MCV: 85.8 fL (ref 80.0–100.0)
MONO ABS: 0.3 10*3/uL (ref 0.2–0.9)
Monocytes Relative: 6 %
NEUTROS ABS: 3.2 10*3/uL (ref 1.4–6.5)
Neutrophils Relative %: 59 %
Platelets: 169 10*3/uL (ref 150–440)
RBC: 3.66 MIL/uL — AB (ref 3.80–5.20)
RDW: 12.9 % (ref 11.5–14.5)
WBC: 5.3 10*3/uL (ref 3.6–11.0)

## 2015-06-06 LAB — COMPREHENSIVE METABOLIC PANEL
ALT: 32 U/L (ref 14–54)
AST: 28 U/L (ref 15–41)
Albumin: 4.1 g/dL (ref 3.5–5.0)
Alkaline Phosphatase: 30 U/L — ABNORMAL LOW (ref 38–126)
Anion gap: 10 (ref 5–15)
BUN: 6 mg/dL (ref 6–20)
CO2: 21 mmol/L — AB (ref 22–32)
CREATININE: 0.36 mg/dL — AB (ref 0.44–1.00)
Calcium: 9.4 mg/dL (ref 8.9–10.3)
Chloride: 105 mmol/L (ref 101–111)
GLUCOSE: 87 mg/dL (ref 65–99)
Potassium: 3.4 mmol/L — ABNORMAL LOW (ref 3.5–5.1)
SODIUM: 136 mmol/L (ref 135–145)
Total Bilirubin: 0.5 mg/dL (ref 0.3–1.2)
Total Protein: 7.3 g/dL (ref 6.5–8.1)

## 2015-06-06 LAB — URINALYSIS COMPLETE WITH MICROSCOPIC (ARMC ONLY)
Bilirubin Urine: NEGATIVE
Glucose, UA: NEGATIVE mg/dL
Hgb urine dipstick: NEGATIVE
Nitrite: NEGATIVE
PROTEIN: NEGATIVE mg/dL
Specific Gravity, Urine: 1.019 (ref 1.005–1.030)
pH: 5 (ref 5.0–8.0)

## 2015-06-06 LAB — WET PREP, GENITAL
Clue Cells Wet Prep HPF POC: NONE SEEN
Trich, Wet Prep: NONE SEEN
WBC WET PREP: NONE SEEN
Yeast Wet Prep HPF POC: NONE SEEN

## 2015-06-06 LAB — CHLAMYDIA/NGC RT PCR (ARMC ONLY)
CHLAMYDIA TR: NOT DETECTED
N GONORRHOEAE: NOT DETECTED

## 2015-06-06 LAB — LIPASE, BLOOD: Lipase: 31 U/L (ref 22–51)

## 2015-06-06 MED ORDER — METOCLOPRAMIDE HCL 5 MG/ML IJ SOLN
10.0000 mg | Freq: Once | INTRAMUSCULAR | Status: AC
  Administered 2015-06-06: 10 mg via INTRAVENOUS

## 2015-06-06 MED ORDER — METOCLOPRAMIDE HCL 5 MG/ML IJ SOLN
INTRAMUSCULAR | Status: AC
  Administered 2015-06-06: 10 mg via INTRAVENOUS
  Filled 2015-06-06: qty 2

## 2015-06-06 MED ORDER — MORPHINE SULFATE 4 MG/ML IJ SOLN
INTRAMUSCULAR | Status: AC
  Administered 2015-06-06: 4 mg via INTRAVENOUS
  Filled 2015-06-06: qty 1

## 2015-06-06 MED ORDER — MORPHINE SULFATE 4 MG/ML IJ SOLN
4.0000 mg | Freq: Once | INTRAMUSCULAR | Status: AC
  Administered 2015-06-06: 4 mg via INTRAVENOUS

## 2015-06-06 MED ORDER — VITAMIN B-6 50 MG PO TABS: 75.0000 mg | ORAL_TABLET | Freq: Every day | ORAL | Status: DC

## 2015-06-06 MED ORDER — SODIUM CHLORIDE 0.9 % IV BOLUS (SEPSIS)
1000.0000 mL | Freq: Once | INTRAVENOUS | Status: AC
  Administered 2015-06-06: 1000 mL via INTRAVENOUS

## 2015-06-06 NOTE — ED Provider Notes (Signed)
[facility] Emergency Department Provider Note  ____________________________________________  Time seen: Approximately 1210 PM  I have reviewed the triage vital signs and the nursing notes.   HISTORY  Chief Complaint Abdominal Pain    HPI Jessica Coffey is a 24 y.o. female who isa G2 P1at 11 weeks who is presenting today with recurrent abdominal pain to the right lower quadrant. She has been having nausea and vomiting and been taking Zofran at home without relief. She saw her OB/GYN this morning at the health department who sent her to the emergency department for hyperemesis with 2+ ketones in her urine. At this time the patient is complaining of right lower quadrant pain which is a 10 out of 10 and sharp. She previously, 1 month ago had an ultrasound which showed an IUP. She denies any vaginal bleeding or discharge. Denies any history of sexually transmitted diseases. Says that the pain is worsening with walking but eases when she eats.   Past Medical History  Diagnosis Date  . Sickle cell trait     There are no active problems to display for this patient.   History reviewed. No pertinent past surgical history.  Current Outpatient Rx  Name  Route  Sig  Dispense  Refill  . promethazine (PHENERGAN) 25 MG tablet   Oral   Take 1 tablet (25 mg total) by mouth every 6 (six) hours as needed for nausea or vomiting.   30 tablet   0     Allergies Review of patient's allergies indicates no known allergies.  No family history on file.  Social History History  Substance Use Topics  . Smoking status: Never Smoker   . Smokeless tobacco: Not on file  . Alcohol Use: No    Review of Systems Constitutional: No fever/chills Eyes: No visual changes. ENT: No sore throat. Cardiovascular: Denies chest pain. Respiratory: Denies shortness of breath. Gastrointestinal:   No diarrhea.  No constipation. Genitourinary: Negative for dysuria. Musculoskeletal:  Negative for back pain. Skin: Negative for rash. Neurological: Negative for headaches, focal weakness or numbness.  10-point ROS otherwise negative.  ____________________________________________   PHYSICAL EXAM:  VITAL SIGNS: ED Triage Vitals  Enc Vitals Group     BP 06/06/15 1002 104/67 mmHg     Pulse Rate 06/06/15 1002 80     Resp 06/06/15 1002 20     Temp 06/06/15 1002 98.4 F (36.9 C)     Temp Source 06/06/15 1002 Oral     SpO2 06/06/15 1002 100 %     Weight 06/06/15 1002 130 lb (58.968 kg)     Height 06/06/15 1002 5\' 5"  (1.651 m)     Head Cir --      Peak Flow --      Pain Score 06/06/15 1003 10     Pain Loc --      Pain Edu? --      Excl. in GC? --     Constitutional: Alert and oriented. Well appearing and in no acute distress. Eyes: Conjunctivae are normal. PERRL. EOMI. Head: Atraumatic. Nose: No congestion/rhinnorhea. Mouth/Throat: Mucous membranes are moist.  Oropharynx non-erythematous. Neck: No stridor.   Cardiovascular: Normal rate, regular rhythm. Grossly normal heart sounds.  Good peripheral circulation. Respiratory: Normal respiratory effort.  No retractions. Lungs CTAB. Gastrointestinal: Soft with tenderness to the right lower quadrant and right upper quadrant of the abdomen. No CVA tenderness. Genitourinary: normal external exam. Thick white discharge on speculum exam which is minimal. Cervix is closed on bimanual  exam. No uterine or adnexal tenderness or masses. Musculoskeletal: No lower extremity tenderness nor edema.  No joint effusions. Neurologic:  Normal speech and language. No gross focal neurologic deficits are appreciated. Speech is normal. No gait instability. Skin:  Skin is warm, dry and intact. No rash noted. Psychiatric: Mood and affect are normal. Speech and behavior are normal.  ____________________________________________   LABS (all labs ordered are listed, but only abnormal results are displayed)  Labs Reviewed  CBC WITH  DIFFERENTIAL/PLATELET - Abnormal; Notable for the following:    RBC 3.66 (*)    Hemoglobin 10.7 (*)    HCT 31.5 (*)    All other components within normal limits  COMPREHENSIVE METABOLIC PANEL - Abnormal; Notable for the following:    Potassium 3.4 (*)    CO2 21 (*)    Creatinine, Ser 0.36 (*)    Alkaline Phosphatase 30 (*)    All other components within normal limits  URINALYSIS COMPLETEWITH MICROSCOPIC ([facility] ONLY) - Abnormal; Notable for the following:    Color, Urine YELLOW (*)    APPearance HAZY (*)    Ketones, ur 1+ (*)    Leukocytes, UA TRACE (*)    Bacteria, UA RARE (*)    Squamous Epithelial / LPF TOO NUMEROUS TO COUNT (*)    All other components within normal limits  CHLAMYDIA/NGC RT PCR ([facility] ONLY)  WET PREP, GENITAL  LIPASE, BLOOD   ____________________________________________  EKG   ____________________________________________  RADIOLOGY  No acute findings on MRI. Appendix not visualized but no signs of appendicitis. ____________________________________________   PROCEDURES    ____________________________________________   INITIAL IMPRESSION / ASSESSMENT AND PLAN / ED COURSE  Pertinent labs & imaging results that were available during my care of the patient were reviewed by me and considered in my medical decision making (see chart for details).  ----------------------------------------- 2:53 PM on 06/06/2015 -----------------------------------------  Patient resting comfortably at this time. Able to tolerate crackers and juice. Has Zofran at home for nausea symptoms. We will add vitamin B6. Patient to follow-up with the health department for her routine prenatal care. We'll discharge to home. ____________________________________________   FINAL CLINICAL IMPRESSION(S) / ED DIAGNOSES  Acute hyperemesis gravidarum. Acute abdominal pain and pregnancy. Return visit.    Myrna Blazer, MD 06/06/15 [phone]

## 2015-06-06 NOTE — Discharge Instructions (Signed)

## 2015-06-06 NOTE — ED Notes (Signed)
Has vomited twice since she left er after midnight

## 2015-06-06 NOTE — ED Notes (Signed)
Still has nausea

## 2015-06-06 NOTE — ED Notes (Signed)
Nausea, rlq abd pain, was seen here last pm for the same, went to md office today and was sent here

## 2015-06-28 ENCOUNTER — Emergency Department
Admission: EM | Admit: 2015-06-28 | Discharge: 2015-06-28 | Disposition: A | Discharge status: Home / Self Care | Payer: Medicaid Other | Attending: Emergency Medicine | Admitting: Emergency Medicine

## 2015-06-28 DIAGNOSIS — O2341 Unspecified infection of urinary tract in pregnancy, first trimester: Secondary | ICD-10-CM

## 2015-06-28 DIAGNOSIS — N390 Urinary tract infection, site not specified: Secondary | ICD-10-CM

## 2015-06-28 DIAGNOSIS — O210 Mild hyperemesis gravidarum: Secondary | ICD-10-CM | POA: Insufficient documentation

## 2015-06-28 DIAGNOSIS — Z3A14 14 weeks gestation of pregnancy: Secondary | ICD-10-CM | POA: Diagnosis not present

## 2015-06-28 DIAGNOSIS — O2392 Unspecified genitourinary tract infection in pregnancy, second trimester: Secondary | ICD-10-CM | POA: Insufficient documentation

## 2015-06-28 LAB — URINALYSIS COMPLETE WITH MICROSCOPIC (ARMC ONLY)
Bilirubin Urine: NEGATIVE
Glucose, UA: NEGATIVE mg/dL
Hgb urine dipstick: NEGATIVE
Nitrite: NEGATIVE
PH: 5 (ref 5.0–8.0)
Protein, ur: NEGATIVE mg/dL
SPECIFIC GRAVITY, URINE: 1.02 (ref 1.005–1.030)

## 2015-06-28 LAB — CBC
HCT: 29.6 % — ABNORMAL LOW (ref 35.0–47.0)
Hemoglobin: 10.4 g/dL — ABNORMAL LOW (ref 12.0–16.0)
MCH: 29.7 pg (ref 26.0–34.0)
MCHC: 35.1 g/dL (ref 32.0–36.0)
MCV: 84.6 fL (ref 80.0–100.0)
Platelets: 202 10*3/uL (ref 150–440)
RBC: 3.5 MIL/uL — ABNORMAL LOW (ref 3.80–5.20)
RDW: 13.1 % (ref 11.5–14.5)
WBC: 6.8 10*3/uL (ref 3.6–11.0)

## 2015-06-28 LAB — COMPREHENSIVE METABOLIC PANEL
ALBUMIN: 4.1 g/dL (ref 3.5–5.0)
ALK PHOS: 41 U/L (ref 38–126)
ALT: 22 U/L (ref 14–54)
AST: 27 U/L (ref 15–41)
Anion gap: 11 (ref 5–15)
BUN: 9 mg/dL (ref 6–20)
CALCIUM: 9.4 mg/dL (ref 8.9–10.3)
CO2: 21 mmol/L — ABNORMAL LOW (ref 22–32)
CREATININE: 0.4 mg/dL — AB (ref 0.44–1.00)
Chloride: 101 mmol/L (ref 101–111)
GFR calc non Af Amer: >60 mL/min (ref >60)
Glucose, Bld: 105 mg/dL — ABNORMAL HIGH (ref 65–99)
Potassium: 3.3 mmol/L — ABNORMAL LOW (ref 3.5–5.1)
SODIUM: 133 mmol/L — AB (ref 135–145)
TOTAL PROTEIN: 7.6 g/dL (ref 6.5–8.1)
Total Bilirubin: 0.6 mg/dL (ref 0.3–1.2)

## 2015-06-28 LAB — LIPASE, BLOOD: Lipase: 16 U/L — ABNORMAL LOW (ref 22–51)

## 2015-06-28 MED ORDER — NITROFURANTOIN MONOHYD MACRO 100 MG PO CAPS: 100.0000 mg | ORAL_CAPSULE | Freq: Two times a day (BID) | ORAL | Status: DC

## 2015-06-28 MED ORDER — ONDANSETRON 4 MG PO TBDP: ORAL_TABLET | ORAL | Status: DC

## 2015-06-28 MED ORDER — NITROFURANTOIN MONOHYD MACRO 100 MG PO CAPS
100.0000 mg | ORAL_CAPSULE | Freq: Once | ORAL | Status: AC
  Administered 2015-06-28: 100 mg via ORAL
  Filled 2015-06-28: qty 1

## 2015-06-28 MED ORDER — SODIUM CHLORIDE 0.9 % IV BOLUS (SEPSIS)
2000.0000 mL | Freq: Once | INTRAVENOUS | Status: AC
  Administered 2015-06-28: 1000 mL via INTRAVENOUS

## 2015-06-28 MED ORDER — ONDANSETRON HCL 4 MG/2ML IJ SOLN
4.0000 mg | Freq: Once | INTRAMUSCULAR | Status: AC
  Administered 2015-06-28: 4 mg via INTRAVENOUS
  Filled 2015-06-28: qty 2

## 2015-06-28 NOTE — Discharge Instructions (Signed)
Take zofran for nausea.   Stay hydrated.  Take macrobid for a week.   Follow up with the health department.   Return to ER if you have vomiting, vaginal bleeding, fever.

## 2015-06-28 NOTE — ED Notes (Signed)
Pt presents to ED, sent by PCP.  Pts she has been n/v.  Pts PCP has sent for referral due to 4+ketones, hyperemesis and pts refusal to take antiemetics po.

## 2015-06-28 NOTE — ED Provider Notes (Signed)
CSN: [id number]     Arrival date & time 06/28/15  1756 History   First MD Initiated Contact with Patient 06/28/15 1818     Chief Complaint  Patient presents with  . Emesis During Pregnancy  . Abnormal Lab     (Consider location/radiation/quality/duration/timing/severity/associated sxs/prior Treatment) The history is provided by the patient.  Jessica Coffey is a 24 y.o. female hx of sickle cell trait, [redacted] weeks pregnant here with vomiting. Patient was diagnosed with hyperemesis several weeks ago but finished her Zofran. Last 3 days she started vomiting again. Denies any fevers or chills or abdominal cramps or vaginal bleeding. Patient went to the health department and was noted to have 4+ ketones in her urine was sent here for evaluation. Patient apparently refused to take ODT Zofran but was given Zofran at health department and simply ran out of Zofran from previous visit.     Past Medical History  Diagnosis Date  . Sickle cell trait    History reviewed. No pertinent past surgical history. No family history on file. History  Substance Use Topics  . Smoking status: Never Smoker   . Smokeless tobacco: Not on file  . Alcohol Use: No   OB History    Gravida Para Term Preterm AB TAB SAB Ectopic Multiple Living   1              Review of Systems  Gastrointestinal: Positive for vomiting.  All other systems reviewed and are negative.     Allergies  Review of patient's allergies indicates no known allergies.  Home Medications   Prior to Admission medications   Medication Sig Start Date End Date Taking? Authorizing Provider  promethazine (PHENERGAN) 25 MG tablet Take 1 tablet (25 mg total) by mouth every 6 (six) hours as needed for nausea or vomiting. 05/03/15   Evangeline Dakin, PA-C  pyridOXINE (VITAMIN B-6) 50 MG tablet Take 1.5 tablets (75 mg total) by mouth daily. 06/06/15   Myrna Blazer, MD   BP 104/69 mmHg  Pulse 85  Temp(Src) 98.4 F (36.9 C) (Oral)  Resp 16   Ht  (1.676 m)  Wt 129 lb (58.514 kg)  BMI 20.83 kg/m2  SpO2 100%  LMP 03/25/2015 (Exact Date) Physical Exam  Constitutional: She is oriented to person, place, and time. She appears well-developed and well-nourished.  HENT:  Head: Normocephalic.  MM dry   Eyes: Conjunctivae are normal. Pupils are equal, round, and reactive to light.  Neck: Normal range of motion. Neck supple.  Cardiovascular: Normal rate, regular rhythm and normal heart sounds.   Pulmonary/Chest: Effort normal and breath sounds normal. No respiratory distress. She has no wheezes. She has no rales.  Abdominal: Soft. Bowel sounds are normal.  Gravid uterus, nontender   Genitourinary: No vaginal discharge found.  Musculoskeletal: Normal range of motion. She exhibits no edema or tenderness.  Neurological: She is alert and oriented to person, place, and time. No cranial nerve deficit. Coordination normal.  Skin: Skin is warm and dry.  Psychiatric: She has a normal mood and affect. Her behavior is normal. Judgment and thought content normal.  Nursing note and vitals reviewed.   ED Course  Procedures (including critical care time)  EMERGENCY DEPARTMENT [place] PREGNANCY "Study: Limited Ultrasound of the Pelvis"  INDICATIONS:Pregnancy(required) Multiple views of the uterus and pelvic cavity are obtained with a multi-frequency probe.  APPROACH:Transabdominal   PERFORMED BY: Myself  IMAGES ARCHIVED?: Yes  LIMITATIONS: Emergent procedure  PREGNANCY FREE FLUID:   PREGNANCY  UTERUS FINDINGS:Uterus enlarged ADNEXAL FINDINGS:Left ovary not seen and Right ovary not seen  PREGNANCY FINDINGS: Intrauterine gestational sac noted, Yolk sac noted, Fetal pole present and Fetal heart activity seen  INTERPRETATION: Viable intrauterine pregnancy  GESTATIONAL AGE, ESTIMATE: 14 wks 5 days  FETAL HEART RATE: 145  COMMENT(Estimate of Gestational Age):  14 wks 5 days      Labs Review Labs Reviewed  LIPASE, BLOOD -  Abnormal; Notable for the following:    Lipase 16 (*)    All other components within normal limits  COMPREHENSIVE METABOLIC PANEL - Abnormal; Notable for the following:    Sodium 133 (*)    Potassium 3.3 (*)    CO2 21 (*)    Glucose, Bld 105 (*)    Creatinine, Ser 0.40 (*)    All other components within normal limits  CBC - Abnormal; Notable for the following:    RBC 3.50 (*)    Hemoglobin 10.4 (*)    HCT 29.6 (*)    All other components within normal limits  URINALYSIS COMPLETEWITH MICROSCOPIC ([facility] ONLY) - Abnormal; Notable for the following:    Color, Urine YELLOW (*)    APPearance CLEAR (*)    Ketones, ur 2+ (*)    Leukocytes, UA TRACE (*)    Bacteria, UA MANY (*)    Squamous Epithelial / LPF 6-30 (*)    All other components within normal limits    Imaging Review No results found.   EKG Interpretation None      MDM   Final diagnoses:  None   Jessica Coffey is a 24 y.o. female here with vomiting. Likely hyperemesis. US confirmed live IUP. Will get labs, UA. Will hydrate and give zofran and PO trial.   9:00 PM Bicarb 21 likely from dehydration. Given 2 L NS. Tolerated PO fluids. UA + bacteria. No symptoms but will treat asymptomatic bacteriuria in pregnancy with macrobid. Will prescribe zofran.   Richardean Canalavid H Yao, MD 06/28/15 2101

## 2015-07-14 ENCOUNTER — Other Ambulatory Visit: Payer: Self-pay | Admitting: Advanced Practice Midwife

## 2015-07-14 DIAGNOSIS — Z3689 Encounter for other specified antenatal screening: Secondary | ICD-10-CM

## 2015-07-17 ENCOUNTER — Emergency Department
Admission: EM | Admit: 2015-07-17 | Discharge: 2015-07-17 | Disposition: A | Discharge status: Home / Self Care | Payer: Medicaid Other | Attending: Student | Admitting: Student

## 2015-07-17 DIAGNOSIS — O9989 Other specified diseases and conditions complicating pregnancy, childbirth and the puerperium: Secondary | ICD-10-CM | POA: Diagnosis present

## 2015-07-17 DIAGNOSIS — R109 Unspecified abdominal pain: Secondary | ICD-10-CM | POA: Diagnosis not present

## 2015-07-17 DIAGNOSIS — Z3A16 16 weeks gestation of pregnancy: Secondary | ICD-10-CM | POA: Diagnosis not present

## 2015-07-17 DIAGNOSIS — O26899 Other specified pregnancy related conditions, unspecified trimester: Secondary | ICD-10-CM

## 2015-07-17 LAB — CBC
HCT: 29 % — ABNORMAL LOW (ref 35.0–47.0)
HEMOGLOBIN: 10.3 g/dL — AB (ref 12.0–16.0)
MCH: 29.9 pg (ref 26.0–34.0)
MCHC: 35.4 g/dL (ref 32.0–36.0)
MCV: 84.4 fL (ref 80.0–100.0)
Platelets: 201 10*3/uL (ref 150–440)
RBC: 3.44 MIL/uL — AB (ref 3.80–5.20)
RDW: 13.2 % (ref 11.5–14.5)
WBC: 7 10*3/uL (ref 3.6–11.0)

## 2015-07-17 LAB — URINALYSIS COMPLETE WITH MICROSCOPIC (ARMC ONLY)
BACTERIA UA: NONE SEEN
BILIRUBIN URINE: NEGATIVE
GLUCOSE, UA: NEGATIVE mg/dL
Hgb urine dipstick: NEGATIVE
Nitrite: NEGATIVE
Protein, ur: NEGATIVE mg/dL
Specific Gravity, Urine: 1.018 (ref 1.005–1.030)
pH: 5 (ref 5.0–8.0)

## 2015-07-17 LAB — HCG, QUANTITATIVE, PREGNANCY: HCG, BETA CHAIN, QUANT, S: 20229 m[IU]/mL — AB (ref <5)

## 2015-07-17 LAB — POCT PREGNANCY, URINE: Preg Test, Ur: POSITIVE — AB

## 2015-07-17 MED ORDER — ACETAMINOPHEN 500 MG PO TABS
1000.0000 mg | ORAL_TABLET | Freq: Once | ORAL | Status: AC
  Administered 2015-07-17: 1000 mg via ORAL
  Filled 2015-07-17: qty 2

## 2015-07-17 NOTE — ED Notes (Signed)
Patient reports mid lower abd pain for 3 days.  Patient also reports being approx [redacted] weeks pregnant.  Denies vaginal bleeding, nausea, vomiting or diarrhea.

## 2015-07-17 NOTE — ED Provider Notes (Signed)
[facility] Emergency Department Provider Note  ____________________________________________  Time seen: Approximately 10:00 PM  I have reviewed the triage vital signs and the nursing notes.   HISTORY  Chief Complaint Abdominal Pain    HPI Guyla Bless is a 24 y.o. female with history of sickle cell trait, G2 P1 at approximately 16 weeks estimated gestational age by last menstrual period as well as ultrasound who presents for evaluation of 3 days of diffuse lower abdominal aching pain, gradual onset, intermittent, worse when she stands and walks around. No nausea, vomiting, fevers, chills, abnormal vaginal bleeding or vaginal discharge. No recent illness. Current severity of symptoms is mild. She is followed by the health Department for prenatal care.  Past Medical History  Diagnosis Date  . Sickle cell trait     There are no active problems to display for this patient.   No past surgical history on file.  Current Outpatient Rx  Name  Route  Sig  Dispense  Refill  . nitrofurantoin, macrocrystal-monohydrate, (MACROBID) 100 MG capsule   Oral   Take 1 capsule (100 mg total) by mouth 2 (two) times daily. X 7 days   14 capsule   0   . pyridOXINE (VITAMIN B-6) 50 MG tablet   Oral   Take 1.5 tablets (75 mg total) by mouth daily.   45 tablet   0   . ondansetron (ZOFRAN ODT) 4 MG disintegrating tablet      4mg  ODT q4 hours prn nausea/vomit   8 tablet   0   . promethazine (PHENERGAN) 25 MG tablet   Oral   Take 1 tablet (25 mg total) by mouth every 6 (six) hours as needed for nausea or vomiting.   30 tablet   0     Allergies Review of patient's allergies indicates no known allergies.  No family history on file.  Social History History  Substance Use Topics  . Smoking status: Never Smoker   . Smokeless tobacco: Not on file  . Alcohol Use: No    Review of Systems Constitutional: No fever/chills Eyes: No visual changes. ENT: No sore  throat. Cardiovascular: Denies chest pain. Respiratory: Denies shortness of breath. Gastrointestinal: + abdominal pain.  No nausea, no vomiting.  No diarrhea.  No constipation. Genitourinary: Negative for dysuria. Musculoskeletal: Negative for back pain. Skin: Negative for rash. Neurological: Negative for headaches, focal weakness or numbness.  10-point ROS otherwise negative.  ____________________________________________   PHYSICAL EXAM:  VITAL SIGNS: ED Triage Vitals  Enc Vitals Group     BP 07/17/15 1910 123/54 mmHg     Pulse Rate 07/17/15 1910 87     Resp 07/17/15 1910 20     Temp 07/17/15 1910 98.2 F (36.8 C)     Temp Source 07/17/15 1910 Oral     SpO2 07/17/15 1910 100 %     Weight 07/17/15 1910 129 lb (58.514 kg)     Height 07/17/15 1910 5\' 5"  (1.651 m)     Head Cir --      Peak Flow --      Pain Score 07/17/15 1911 10     Pain Loc --      Pain Edu? --      Excl. in GC? --     Constitutional: Alert and oriented. Well appearing and in no acute distress. Eyes: Conjunctivae are normal. PERRL. EOMI. Head: Atraumatic. Nose: No congestion/rhinnorhea. Mouth/Throat: Mucous membranes are moist.  Oropharynx non-erythematous. Neck: No stridor Cardiovascular: Normal rate, regular rhythm.  Grossly normal heart sounds.  Good peripheral circulation. Respiratory: Normal respiratory effort.  No retractions. Lungs CTAB. Gastrointestinal: Abdomen is soft, gravid nontender uterus with fundus palpated between the umbilicus and the pubic symphysis. No tenderness to palpation. No distention. No abdominal bruits. No CVA tenderness. Genitourinary: deferred Musculoskeletal: No lower extremity tenderness nor edema.  No joint effusions. Neurologic:  Normal speech and language. No gross focal neurologic deficits are appreciated. No gait instability. Skin:  Skin is warm, dry and intact. No rash noted. Psychiatric: Mood and affect are normal. Speech and behavior are  normal.  ____________________________________________   LABS (all labs ordered are listed, but only abnormal results are displayed)  Labs Reviewed  CBC - Abnormal; Notable for the following:    RBC 3.44 (*)    Hemoglobin 10.3 (*)    HCT 29.0 (*)    All other components within normal limits  HCG, QUANTITATIVE, PREGNANCY - Abnormal; Notable for the following:    hCG, Beta Chain, Quant, S 20229 (*)    All other components within normal limits  URINALYSIS COMPLETEWITH MICROSCOPIC ([facility] ONLY) - Abnormal; Notable for the following:    Color, Urine AMBER (*)    APPearance HAZY (*)    Ketones, ur TRACE (*)    Leukocytes, UA 1+ (*)    Squamous Epithelial / LPF 0-5 (*)    All other components within normal limits  POCT PREGNANCY, URINE - Abnormal; Notable for the following:    Preg Test, Ur POSITIVE (*)    All other components within normal limits   ____________________________________________  EKG  none ____________________________________________  RADIOLOGY  Limited emergency permit bedside ultrasound performed by me shows single live intrauterine pregnancy with fetal heart rate of 167 bpm. Excellent fetal movement. ____________________________________________   PROCEDURES  Procedure(s) performed: None  Critical Care performed: No  ____________________________________________   INITIAL IMPRESSION / ASSESSMENT AND PLAN / ED COURSE  Pertinent labs & imaging results that were available during my care of the patient were reviewed by me and considered in my medical decision making (see chart for details).  Riki Berninger is a 24 y.o. female with history of sickle cell trait, G2 P1 at approximately 16 weeks estimated gestational age by last menstrual period as well as ultrasound who presents for evaluation of 3 days of diffuse lower abdominal aching pain. On exam, she is very well-appearing and in no acute distress. Vital signs stable, she is afebrile. Labs notable for mild  anemia. Urinalysis is not consistent with infection. She appears well-hydrated. She has no tenderness to palpation throughout the abdomen. Doubt acute appendicitis or any acute ovarian pathology especially given no laterality of pain. My suspicion is for round ligament pain given that it is worsened when she stands. No evidence of hernia sac. Discussed symptomatic treatment with Tylenol, need for close OB follow-up, return precautions. She is comfortable with discharge plan. ____________________________________________   FINAL CLINICAL IMPRESSION(S) / ED DIAGNOSES  Final diagnoses:  Abdominal pain in pregnancy      Gayla Doss, MD 07/17/15 2256

## 2015-07-27 ENCOUNTER — Encounter: Payer: Self-pay | Admitting: Emergency Medicine

## 2015-07-27 ENCOUNTER — Emergency Department
Admission: EM | Admit: 2015-07-27 | Discharge: 2015-07-27 | Disposition: A | Discharge status: Home / Self Care | Payer: Medicaid Other | Attending: Emergency Medicine | Admitting: Emergency Medicine

## 2015-07-27 DIAGNOSIS — Z79899 Other long term (current) drug therapy: Secondary | ICD-10-CM | POA: Insufficient documentation

## 2015-07-27 DIAGNOSIS — O9A212 Injury, poisoning and certain other consequences of external causes complicating pregnancy, second trimester: Secondary | ICD-10-CM | POA: Insufficient documentation

## 2015-07-27 DIAGNOSIS — Z3A18 18 weeks gestation of pregnancy: Secondary | ICD-10-CM | POA: Diagnosis not present

## 2015-07-27 DIAGNOSIS — M25552 Pain in left hip: Secondary | ICD-10-CM | POA: Insufficient documentation

## 2015-07-27 MED ORDER — ACETAMINOPHEN-CODEINE #3 300-30 MG PO TABS
1.0000 | ORAL_TABLET | Freq: Once | ORAL | Status: AC
  Administered 2015-07-27: 1 via ORAL
  Filled 2015-07-27: qty 1

## 2015-07-27 MED ORDER — ACETAMINOPHEN-CODEINE #3 300-30 MG PO TABS: 1.0000 | ORAL_TABLET | ORAL | Status: DC | PRN

## 2015-07-27 NOTE — Discharge Instructions (Signed)
Hip Pain Your hip is the joint between your upper legs and your lower pelvis. The bones, cartilage, tendons, and muscles of your hip joint perform a lot of work each day supporting your body weight and allowing you to move around. Hip pain can range from a minor ache to severe pain in one or both of your hips. Pain may be felt on the inside of the hip joint near the groin, or the outside near the buttocks and upper thigh. You may have swelling or stiffness as well.  HOME CARE INSTRUCTIONS   Take medicines only as directed by your health care provider.  Apply ice to the injured area:  Put ice in a plastic bag.  Place a towel between your skin and the bag.  Leave the ice on for 15-20 minutes at a time, 3-4 times a day.  Keep your leg raised (elevated) when possible to lessen swelling.  Avoid activities that cause pain.  Follow specific exercises as directed by your health care provider.  Sleep with a pillow between your legs on your most comfortable side.  Record how often you have hip pain, the location of the pain, and what it feels like. SEEK MEDICAL CARE IF:   You are unable to put weight on your leg.  Your hip is red or swollen or very tender to touch.  Your pain or swelling continues or worsens after 1 week.  You have increasing difficulty walking.  You have a fever. SEEK IMMEDIATE MEDICAL CARE IF:   You have fallen.  You have a sudden increase in pain and swelling in your hip. MAKE SURE YOU:   Understand these instructions.  Will watch your condition.  Will get help right away if you are not doing well or get worse. Document Released: 05/16/2010 Document Revised: 04/12/2014 Document Reviewed: 07/23/2013 Exit[employer] Patient Information 2015 [employer]. This information is not intended to replace advice given to you by your health care provider. Make sure you discuss any questions you have with your health care provider.  

## 2015-07-27 NOTE — ED Notes (Addendum)
Reports pain in lower back radiating down left hip x 2 days. [redacted] wks pregnant

## 2015-07-27 NOTE — ED Provider Notes (Signed)
[facility] Emergency Department Provider Note  ____________________________________________  Time seen: Approximately 6:22 PM  I have reviewed the triage vital signs and the nursing notes.   HISTORY  Chief Complaint Hip Pain and Back Pain   HPI Jessica Coffey is a 24 y.o. female who presents for evaluation of left hip pain. Denies any trauma states that the pain has been going on for about 2 days. She is presently [redacted] weeks pregnant as any abdominal pain no bleeding or dysuria.   Past Medical History  Diagnosis Date  . Sickle cell trait     There are no active problems to display for this patient.   History reviewed. No pertinent past surgical history.  Current Outpatient Rx  Name  Route  Sig  Dispense  Refill  . acetaminophen-codeine (TYLENOL #3) 300-30 MG per tablet   Oral   Take 1 tablet by mouth every 4 (four) hours as needed for moderate pain.   10 tablet   0   . nitrofurantoin, macrocrystal-monohydrate, (MACROBID) 100 MG capsule   Oral   Take 1 capsule (100 mg total) by mouth 2 (two) times daily. X 7 days   14 capsule   0   . ondansetron (ZOFRAN ODT) 4 MG disintegrating tablet       ODT q4 hours prn nausea/vomit   8 tablet   0   . promethazine (PHENERGAN) 25 MG tablet   Oral   Take 1 tablet (25 mg total) by mouth every 6 (six) hours as needed for nausea or vomiting.   30 tablet   0   . pyridOXINE (VITAMIN B-6) 50 MG tablet   Oral   Take 1.5 tablets (75 mg total) by mouth daily.   45 tablet   0     Allergies Review of patient's allergies indicates no known allergies.  History reviewed. No pertinent family history.  Social History Social History  Substance Use Topics  . Smoking status: Never Smoker   . Smokeless tobacco: None  . Alcohol Use: No    Review of Systems Constitutional: No fever/chills Eyes: No visual changes. ENT: No sore throat. Cardiovascular: Denies chest pain. Respiratory: Denies shortness of  breath. Gastrointestinal: No abdominal pain.  No nausea, no vomiting.  No diarrhea.  No constipation. Genitourinary: Negative for dysuria. Musculoskeletal: Positive for left hip pain Skin: Negative for rash. Neurological: Negative for headaches, focal weakness or numbness.  10-point ROS otherwise negative.  ____________________________________________   PHYSICAL EXAM:  VITAL SIGNS: ED Triage Vitals  Enc Vitals Group     BP 07/27/15 1723 102/48 mmHg     Pulse Rate 07/27/15 1723 86     Resp 07/27/15 1723 18     Temp 07/27/15 1723 97.4 F (36.3 C)     Temp Source 07/27/15 1723 Oral     SpO2 07/27/15 1723 98 %     Weight 07/27/15 1723 130 lb (58.968 kg)     Height 07/27/15 1723  (1.651 m)     Head Cir --      Peak Flow --      Pain Score 07/27/15 1725 10     Pain Loc --      Pain Edu? --      Excl. in GC? --     Constitutional: Alert and oriented. Well appearing and in no acute distress. Eyes: Conjunctivae are normal. PERRL. EOMI. Head: Atraumatic. Nose: No congestion/rhinnorhea. Mouth/Throat: Mucous membranes are moist.  Oropharynx non-erythematous. Neck: No stridor.   Cardiovascular:  Normal rate, regular rhythm. Grossly normal heart sounds.  Good peripheral circulation. Respiratory: Normal respiratory effort.  No retractions. Lungs CTAB. Gastrointestinal: Soft and nontender. No distention. No abdominal bruits. No CVA tenderness. Musculoskeletal: Left hip pain with straight leg raise only. No pelvic tenderness. No ecchymosis. Able to abduct. Neurologic:  Normal speech and language. No gross focal neurologic deficits are appreciated. No gait instability. Skin:  Skin is warm, dry and intact. No rash noted. Psychiatric: Mood and affect are normal. Speech and behavior are normal.  ____________________________________________   LABS (all labs ordered are listed, but only abnormal results are displayed)  Labs Reviewed - No data to display  RADIOLOGY  Deferred  secondary to pregnancy. ____________________________________________   PROCEDURES  Procedure(s) performed: None  Critical Care performed: No  ____________________________________________   INITIAL IMPRESSION / ASSESSMENT AND PLAN / ED COURSE  Pertinent labs & imaging results that were available during my care of the patient were reviewed by me and considered in my medical decision making (see chart for details).  Left hip pain etiology unknown. Rx given for Tylenol 3 to take 1 every 4 hours as needed for pain follow up PCP or OB/GYN as directed.  No other emergency medical complaints at this time. Emesis stable ____________________________________________   FINAL CLINICAL IMPRESSION(S) / ED DIAGNOSES  Final diagnoses:  Hip pain, acute, left      Evangeline Dakin, PA-C 07/27/15 1855  Phineas Semen, MD 07/27/15 2137

## 2015-08-05 ENCOUNTER — Ambulatory Visit
Admission: RE | Admit: 2015-08-05 | Discharge: 2015-08-05 | Disposition: A | Discharge status: Home / Self Care | Payer: Medicaid Other | Source: Ambulatory Visit | Attending: Advanced Practice Midwife | Admitting: Advanced Practice Midwife

## 2015-08-05 DIAGNOSIS — Z3689 Encounter for other specified antenatal screening: Secondary | ICD-10-CM

## 2015-08-05 DIAGNOSIS — Z36 Encounter for antenatal screening of mother: Secondary | ICD-10-CM | POA: Diagnosis not present

## 2015-09-12 ENCOUNTER — Observation Stay
Admission: EM | Admit: 2015-09-12 | Discharge: 2015-09-12 | Disposition: A | Discharge status: Home / Self Care | Payer: Medicaid Other | Attending: Obstetrics and Gynecology | Admitting: Obstetrics and Gynecology

## 2015-09-12 DIAGNOSIS — R109 Unspecified abdominal pain: Secondary | ICD-10-CM | POA: Diagnosis present

## 2015-09-12 DIAGNOSIS — O26899 Other specified pregnancy related conditions, unspecified trimester: Secondary | ICD-10-CM

## 2015-09-12 DIAGNOSIS — Z3A25 25 weeks gestation of pregnancy: Secondary | ICD-10-CM | POA: Diagnosis not present

## 2015-09-12 DIAGNOSIS — O26892 Other specified pregnancy related conditions, second trimester: Secondary | ICD-10-CM | POA: Diagnosis not present

## 2015-09-12 DIAGNOSIS — R1013 Epigastric pain: Secondary | ICD-10-CM | POA: Insufficient documentation

## 2015-09-12 MED ORDER — FAMOTIDINE 20 MG PO TABS
20.0000 mg | ORAL_TABLET | Freq: Once | ORAL | Status: AC
  Administered 2015-09-12: 20 mg via ORAL
  Filled 2015-09-12: qty 1

## 2015-09-12 MED ORDER — ALUM & MAG HYDROXIDE-SIMETH 200-200-20 MG/5ML PO SUSP
30.0000 mL | Freq: Once | ORAL | Status: AC
  Administered 2015-09-12: 30 mL via ORAL
  Filled 2015-09-12: qty 30

## 2015-09-12 NOTE — Discharge Summary (Signed)
Pt reports pain is much improved after medication. Advised to limit fried food/food high in fat content and to take daily Zantac or pepcid prn for GERD. F/u at office on 10/13 as previously scheduled. Questions answered.

## 2015-09-12 NOTE — OB Triage Note (Signed)
Obstetric History and Physical  Jessica Coffey is a 24 y.o. G2P1 with Estimated Date of Delivery: 12/21/15 per [redacted]w[redacted]d [place] who presents at [redacted]w[redacted]d  presenting for upper abdominal pain. Reports pain started last night and has continued throughout the day prompting her to come in to be seen. No nausea, vomiting, diarrhea, dysuria. Pt reports eating a hot dog and pizza yesterday and [employer] today. Patient states she has been having no contractions, no vaginal bleeding, intact membranes, with active fetal movement.    Prenatal Course Source of Care: [facility], transferred from [facility] at 24 wks.  Pregnancy complications or risks: -Recurrent UTI (currently with GBS+ UTI urine culture, pt has not started Rx yet) -Sickle cell trait -Anemia   Patient Active Problem List   Diagnosis Date Noted  . Abdominal pain affecting pregnancy 09/12/2015    Prenatal labs and studies: ABO, Rh: O+  Antibody: Neg Rubella: Immune Varicella: non-immune RPR:  Non-reactive HBsAg:  negative HIV: negative GC/CT: neg/neg GBS: unknown 1 hr Glucola: unknown   Genetic screening: Quad negative    Prenatal Transfer Tool   Past Medical History  Diagnosis Date  . Sickle cell trait (HCC)     History reviewed. No pertinent past surgical history.  OB History  Gravida Para Term Preterm AB SAB TAB Ectopic Multiple Living  # Outcome Date GA Lbr Len/2nd Weight Sex Delivery Anes PTL Lv  2 Current           1 Gravida 08/16/09    F    Y      Social History   Social History  . Marital Status: Single    Spouse Name: N/A  . Number of Children: N/A  . Years of Education: N/A   Social History Main Topics  . Smoking status: Never Smoker   . Smokeless tobacco: None  . Alcohol Use: No  . Drug Use: No  . Sexual Activity: Not Asked   Other Topics Concern  . None   Social History Narrative    History reviewed. No pertinent family history.  Prescriptions prior to admission  Medication Sig Dispense  Refill Last Dose  Prenatal Vitamins Vitamin C  No Known Allergies  Review of Systems: Negative except for what is mentioned in HPI.  Physical Exam: Temp(Src) 98.6 F (37 C) (Oral)  LMP 03/25/2015 (Exact Date) GENERAL: Well-developed, well-nourished female in no acute distress.  ABDOMEN: soft, tenderness to epigastric region with palpation EXTREMITIES: Nontender, no edema FHT: +FHTs, appropriate for gestational age Contractions: rare   Pertinent Labs/Studies:   No results found for this or any previous visit (from the past 24 hour(s)).  Assessment : IUP at [redacted]w[redacted]d with epigastric pain  Plan: Maalox and pepcid for GERD If pain not relieved by above, consider GI [place]

## 2015-09-12 NOTE — Discharge Instructions (Signed)
Call provider or return to birthplace with:  1. Regular contractions 2. Leaking of fluid from your vagina 3. Vaginal bleeding: Bright red or heavy like a period 4. Decreased Fetal movement  Limit the fat in your diet. May take maalox, pepcid or zantac over the counter for stomach upset Keep you next scheduled appt.

## 2015-09-12 NOTE — OB Triage Note (Signed)
Pt reports waking up in the middle of the night from sharp abdominal pain and then being woke up again around 6am this morning with the same pain, rating it a 10 on the pain scale.  Pt states that she came to the hospital now because the pain was getting worse and she "can't stand it anymore".  Pt denies contractions, N/V/D, or urinary symptoms.  Pt placed on EFM and toco, abdomen palpates relaxed.  Pt reports pain when lightly pressing on upper abdomen.

## 2015-10-18 ENCOUNTER — Observation Stay
Admission: EM | Admit: 2015-10-18 | Discharge: 2015-10-19 | Disposition: A | Discharge status: Home / Self Care | Payer: Medicaid Other | Attending: Obstetrics and Gynecology | Admitting: Obstetrics and Gynecology

## 2015-10-18 DIAGNOSIS — Z3A31 31 weeks gestation of pregnancy: Secondary | ICD-10-CM | POA: Diagnosis not present

## 2015-10-18 DIAGNOSIS — R109 Unspecified abdominal pain: Secondary | ICD-10-CM | POA: Diagnosis present

## 2015-10-18 DIAGNOSIS — O26893 Other specified pregnancy related conditions, third trimester: Principal | ICD-10-CM | POA: Insufficient documentation

## 2015-10-18 DIAGNOSIS — O26899 Other specified pregnancy related conditions, unspecified trimester: Secondary | ICD-10-CM

## 2015-10-18 LAB — URINALYSIS COMPLETE WITH MICROSCOPIC (ARMC ONLY)
Bilirubin Urine: NEGATIVE
Glucose, UA: NEGATIVE mg/dL
Hgb urine dipstick: NEGATIVE
Ketones, ur: NEGATIVE mg/dL
Nitrite: NEGATIVE
Protein, ur: NEGATIVE mg/dL
Specific Gravity, Urine: 1.016 (ref 1.005–1.030)
pH: 6 (ref 5.0–8.0)

## 2015-10-18 MED ORDER — ACETAMINOPHEN 325 MG PO TABS: 650.0000 mg | ORAL_TABLET | ORAL | Status: DC | PRN

## 2015-10-18 NOTE — H&P (Signed)
Obstetrics Admission History & Physical  10/18/2015 - 11:20 PM Primary OBGYN: [facility]  Chief Complaint: abdominal pain x 3 days  History of Present Illness  24 y.o. G2P1001 @ 8484w0d (Dating: EDC 1/10, by 5wk u/s), with the above CC. Pregnancy complicated by: [place] trait, GBS bacteruria, h/o THC use.  Jessica Coffey states that she's had the off and on s/s for the past few days. No fevers, chills, nausea, vomiting, chest pain, SOB, decreased FM, dysuria, hematuria, VB or discharge. Nothing per vagina in the past 2 days. Patient had PNV today and had hip pains, per the note, no SVE but had UCx sent  Review of Systems: her 12 point review of systems is negative or as noted in the History of Present Illness.  PMHx:  Past Medical History  Diagnosis Date  . Sickle cell trait (HCC)    PSHx: No past surgical history on file. Medications: none  Allergies: has No Known Allergies. OBHx:  OB History  Gravida Para Term Preterm AB SAB TAB Ectopic Multiple Living  2         1    # Outcome Date GA Lbr Len/2nd Weight Sex Delivery Anes PTL Lv  2 Current           1 Gravida 08/16/09    F    Y      GYNHx:  History of abnormal pap smears: No. History of STIs: No..             FHx: No family history on file. Soc Hx:  Social History   Social History  . Marital Status: Single    Spouse Name: N/A  . Number of Children: N/A  . Years of Education: N/A   Occupational History  . Not on file.   Social History Main Topics  . Smoking status: Never Smoker   . Smokeless tobacco: Not on file  . Alcohol Use: No  . Drug Use: No  . Sexual Activity: Not on file   Other Topics Concern  . Not on file   Social History Narrative    Objective    Current Vital Signs 24h Vital Sign Ranges  T 98.3 F (36.8 C) Temp  Avg: 98.2 F (36.8 C)  Min: 98.1 F (36.7 C)  Max: 98.3 F (36.8 C)  BP 106/61 mmHg BP  Min: 106/61  Max: 106/61  HR 85 Pulse  Avg: 85  Min: 85  Max: 85  RR 18 Resp  Avg: 17  Min:  16  Max: 18  SaO2    98RA No Data Recorded       24 Hour I/O Current Shift I/O  Time Ins Outs       EFM: 140 baseline, +accels, no decels, mod variability  Toco: quiet  General: Well nourished, well developed female in no acute distress.  Skin:  Warm and dry.  Cardiovascular: Regular rate and rhythm. Respiratory:  Clear to auscultation bilateral. Normal respiratory effort Abdomen: gravid, NTTP Back: no CVAT Neuro/Psych:  Normal mood and affect.   SSE: visually closed, normal and minimal amount of d/c in vault. nttp SVE: cl/long/high  Labs  U/a, GC/CT sent. Wet prep negatie  Radiology none  Perinatal info  O POS/ Rubella  Immune / Varicella Not immune/RPR neg/HIV Negative/HepB Surf Ag Negative/TDaP: needed / Flu shot: needed/pap needed PP/  Assessment & Plan   24 y.o. G2P1001 @ 31/0 with signs and symptoms, with likely RL pain *IUP: rNST *EOL: negative. FFN not sent due  to negative EFM and SVE. Advised belly binder. Follow up GC/CT  *Dispo: d/c to home  Cornelia Copa. MD [facility] Pager [phone]

## 2015-10-19 DIAGNOSIS — O26893 Other specified pregnancy related conditions, third trimester: Secondary | ICD-10-CM | POA: Diagnosis not present

## 2015-10-19 LAB — CHLAMYDIA/NGC RT PCR (ARMC ONLY)
Chlamydia Tr: NOT DETECTED
N GONORRHOEAE: NOT DETECTED

## 2015-10-19 NOTE — Discharge Summary (Signed)
OB Triage Discharge Summary   24 y.o. G2P1001 @ 8371w0d presenting for abdominal pain.  Fetal status reassuring. EFM showed reactive NST and negative toco Triage course: negative SSE, SVE (cl/long/high), wet prep, GC/CT and u/a. UCx sent from clinic on 11/8  Labs pending: none RTC 2wks Disposition: Home  [place] Bingharlie Aili Casillas, Montez HagemanJr MD [facility]  Pager: [phone]

## 2015-10-24 ENCOUNTER — Telehealth: Payer: Self-pay | Admitting: Obstetrics and Gynecology

## 2015-11-06 ENCOUNTER — Observation Stay: Payer: Medicaid Other

## 2015-11-06 ENCOUNTER — Observation Stay
Admission: EM | Admit: 2015-11-06 | Discharge: 2015-11-06 | Disposition: A | Discharge status: Home / Self Care | Payer: Medicaid Other | Attending: Certified Nurse Midwife | Admitting: Certified Nurse Midwife

## 2015-11-06 ENCOUNTER — Encounter: Payer: Self-pay | Admitting: *Deleted

## 2015-11-06 DIAGNOSIS — M546 Pain in thoracic spine: Secondary | ICD-10-CM | POA: Insufficient documentation

## 2015-11-06 DIAGNOSIS — R1010 Upper abdominal pain, unspecified: Secondary | ICD-10-CM

## 2015-11-06 DIAGNOSIS — O26899 Other specified pregnancy related conditions, unspecified trimester: Secondary | ICD-10-CM

## 2015-11-06 DIAGNOSIS — O26893 Other specified pregnancy related conditions, third trimester: Principal | ICD-10-CM | POA: Insufficient documentation

## 2015-11-06 DIAGNOSIS — R109 Unspecified abdominal pain: Secondary | ICD-10-CM

## 2015-11-06 DIAGNOSIS — Z3A33 33 weeks gestation of pregnancy: Secondary | ICD-10-CM | POA: Diagnosis not present

## 2015-11-06 DIAGNOSIS — D573 Sickle-cell trait: Secondary | ICD-10-CM | POA: Diagnosis not present

## 2015-11-06 DIAGNOSIS — R1013 Epigastric pain: Secondary | ICD-10-CM | POA: Diagnosis not present

## 2015-11-06 LAB — COMPREHENSIVE METABOLIC PANEL
ALK PHOS: 103 U/L (ref 38–126)
ALT: 8 U/L — ABNORMAL LOW (ref 14–54)
ANION GAP: 7 (ref 5–15)
AST: 21 U/L (ref 15–41)
Albumin: 2.9 g/dL — ABNORMAL LOW (ref 3.5–5.0)
BILIRUBIN TOTAL: 0.5 mg/dL (ref 0.3–1.2)
BUN: <5 mg/dL — ABNORMAL LOW (ref 6–20)
CALCIUM: 8.5 mg/dL — AB (ref 8.9–10.3)
CO2: 22 mmol/L (ref 22–32)
Chloride: 109 mmol/L (ref 101–111)
Creatinine, Ser: 0.44 mg/dL (ref 0.44–1.00)
GFR calc non Af Amer: >60 mL/min (ref >60)
GLUCOSE: 73 mg/dL (ref 65–99)
Potassium: 3.7 mmol/L (ref 3.5–5.1)
Sodium: 138 mmol/L (ref 135–145)
TOTAL PROTEIN: 6.3 g/dL — AB (ref 6.5–8.1)

## 2015-11-06 LAB — URINALYSIS COMPLETE WITH MICROSCOPIC (ARMC ONLY)
Bilirubin Urine: NEGATIVE
GLUCOSE, UA: NEGATIVE mg/dL
Hgb urine dipstick: NEGATIVE
Ketones, ur: NEGATIVE mg/dL
Leukocytes, UA: NEGATIVE
NITRITE: NEGATIVE
Protein, ur: 30 mg/dL — AB
RBC / HPF: NONE SEEN RBC/hpf (ref 0–5)
SPECIFIC GRAVITY, URINE: 1.012 (ref 1.005–1.030)
pH: 6 (ref 5.0–8.0)

## 2015-11-06 LAB — FERRITIN: FERRITIN: 9 ng/mL — AB (ref 11–307)

## 2015-11-06 LAB — VITAMIN B12: VITAMIN B 12: 280 pg/mL (ref 180–914)

## 2015-11-06 LAB — CBC
HEMATOCRIT: 24.9 % — AB (ref 35.0–47.0)
HEMOGLOBIN: 8.2 g/dL — AB (ref 12.0–16.0)
MCH: 29.1 pg (ref 26.0–34.0)
MCHC: 33.1 g/dL (ref 32.0–36.0)
MCV: 88 fL (ref 80.0–100.0)
Platelets: 130 10*3/uL — ABNORMAL LOW (ref 150–440)
RBC: 2.83 MIL/uL — ABNORMAL LOW (ref 3.80–5.20)
RDW: 13.2 % (ref 11.5–14.5)
WBC: 7.9 10*3/uL (ref 3.6–11.0)

## 2015-11-06 LAB — LIPASE, BLOOD: Lipase: 20 U/L (ref 11–51)

## 2015-11-06 MED ORDER — LACTATED RINGERS IV SOLN: 500.0000 mL | INTRAVENOUS | Status: DC | PRN

## 2015-11-06 MED ORDER — FAMOTIDINE IN NACL 20-0.9 MG/50ML-% IV SOLN
20.0000 mg | Freq: Two times a day (BID) | INTRAVENOUS | Status: DC
  Administered 2015-11-06: 20 mg via INTRAVENOUS
  Filled 2015-11-06: qty 50

## 2015-11-06 MED ORDER — LACTATED RINGERS IV SOLN
INTRAVENOUS | Status: DC
Start: 2015-11-06 — End: 2015-11-06

## 2015-11-06 NOTE — Progress Notes (Signed)
L&D Triage Note  S: Is now sleeping. Awaiting abdominal ultrasound  O:  Results for orders placed or performed during the hospital encounter of 11/06/15 (from the past 24 hour(s))  Urinalysis complete, with microscopic ([facility] only)     Status: Abnormal   Collection Time: 11/06/15  1:52 PM  Result Value Ref Range   Color, Urine YELLOW (A) YELLOW   APPearance CLEAR (A) CLEAR   Glucose, UA NEGATIVE NEGATIVE mg/dL   Bilirubin Urine NEGATIVE NEGATIVE   Ketones, ur NEGATIVE NEGATIVE mg/dL   Specific Gravity, Urine 1.012 1.005 - 1.030   Hgb urine dipstick NEGATIVE NEGATIVE   pH 6.0 5.0 - 8.0   Protein, ur 30 (A) NEGATIVE mg/dL   Nitrite NEGATIVE NEGATIVE   Leukocytes, UA NEGATIVE NEGATIVE   RBC / HPF NONE SEEN 0 - 5 RBC/hpf   WBC, UA 0-5 0 - 5 WBC/hpf   Bacteria, UA RARE (A) NONE SEEN   Squamous Epithelial / LPF 0-5 (A) NONE SEEN   Mucous PRESENT   CBC     Status: Abnormal   Collection Time: 11/06/15  2:20 PM  Result Value Ref Range   WBC 7.9 3.6 - 11.0 K/uL   RBC 2.83 (L) 3.80 - 5.20 MIL/uL   Hemoglobin 8.2 (L) 12.0 - 16.0 g/dL   HCT 09.824.9 (L) 11.935.0 - 14.747.0 %   MCV 88.0 80.0 - 100.0 fL   MCH 29.1 26.0 - 34.0 pg   MCHC 33.1 32.0 - 36.0 g/dL   RDW 82.913.2 56.211.5 - 13.014.5 %   Platelets 130 (L) 150 - 440 K/uL  Comprehensive metabolic panel     Status: Abnormal   Collection Time: 11/06/15  2:20 PM  Result Value Ref Range   Sodium 138 135 - 145 mmol/L   Potassium 3.7 3.5 - 5.1 mmol/L   Chloride 109 101 - 111 mmol/L   CO2 22 22 - 32 mmol/L   Glucose, Bld 73 65 - 99 mg/dL   BUN <5 (L) 6 - 20 mg/dL   Creatinine, Ser 8.650.44 0.44 - 1.00 mg/dL   Calcium 8.5 (L) 8.9 - 10.3 mg/dL   Total Protein 6.3 (L) 6.5 - 8.1 g/dL   Albumin 2.9 (L) 3.5 - 5.0 g/dL   AST 21 15 - 41 U/L   ALT 8 (L) 14 - 54 U/L   Alkaline Phosphatase 103 38 - 126 U/L   Total Bilirubin 0.5 0.3 - 1.2 mg/dL   GFR calc non Af Amer >60 >60 mL/min   GFR calc Af Amer >60 >60 mL/min   Anion gap 7 5 - 15  Lipase, blood     Status:  None   Collection Time: 11/06/15  2:20 PM  Result Value Ref Range   Lipase 20 11 - 51 U/L  FHR was reactive, and this was discontinued  Toco: acontractile  A: No evidence of UTI on UA Normal WBC Anemia R/O iron deficiency Normal LFTs and lipase (no evidence of pancreatitis)  P: Ferratin level ordered Awaiting abdominal ultrasound Pepcid IV for heartburn  Saleh Ulbrich, CNM

## 2015-11-06 NOTE — Final Progress Note (Signed)
Final Progress Note  S: Hungry, feeling much better  O:BP 96/60 mmHg  Pulse 93  Temp(Src) 99 F (37.2 C) (Oral)  Resp 16  LMP 03/25/2015 (Exact Date)  General: smiling, eating some food Results for orders placed or performed during the hospital encounter of 11/06/15 (from the past 24 hour(s))  Urinalysis complete, with microscopic ([facility] only)     Status: Abnormal   Collection Time: 11/06/15  1:52 PM  Result Value Ref Range   Color, Urine YELLOW (A) YELLOW   APPearance CLEAR (A) CLEAR   Glucose, UA NEGATIVE NEGATIVE mg/dL   Bilirubin Urine NEGATIVE NEGATIVE   Ketones, ur NEGATIVE NEGATIVE mg/dL   Specific Gravity, Urine 1.012 1.005 - 1.030   Hgb urine dipstick NEGATIVE NEGATIVE   pH 6.0 5.0 - 8.0   Protein, ur 30 (A) NEGATIVE mg/dL   Nitrite NEGATIVE NEGATIVE   Leukocytes, UA NEGATIVE NEGATIVE   RBC / HPF NONE SEEN 0 - 5 RBC/hpf   WBC, UA 0-5 0 - 5 WBC/hpf   Bacteria, UA RARE (A) NONE SEEN   Squamous Epithelial / LPF 0-5 (A) NONE SEEN   Mucous PRESENT   CBC     Status: Abnormal   Collection Time: 11/06/15  2:20 PM  Result Value Ref Range   WBC 7.9 3.6 - 11.0 K/uL   RBC 2.83 (L) 3.80 - 5.20 MIL/uL   Hemoglobin 8.2 (L) 12.0 - 16.0 g/dL   HCT 56.2 (L) 13.0 - 86.5 %   MCV 88.0 80.0 - 100.0 fL   MCH 29.1 26.0 - 34.0 pg   MCHC 33.1 32.0 - 36.0 g/dL   RDW 78.4 69.6 - 29.5 %   Platelets 130 (L) 150 - 440 K/uL  Comprehensive metabolic panel     Status: Abnormal   Collection Time: 11/06/15  2:20 PM  Result Value Ref Range   Sodium 138 135 - 145 mmol/L   Potassium 3.7 3.5 - 5.1 mmol/L   Chloride 109 101 - 111 mmol/L   CO2 22 22 - 32 mmol/L   Glucose, Bld 73 65 - 99 mg/dL   BUN <5 (L) 6 - 20 mg/dL   Creatinine, Ser 2.84 0.44 - 1.00 mg/dL   Calcium 8.5 (L) 8.9 - 10.3 mg/dL   Total Protein 6.3 (L) 6.5 - 8.1 g/dL   Albumin 2.9 (L) 3.5 - 5.0 g/dL   AST 21 15 - 41 U/L   ALT 8 (L) 14 - 54 U/L   Alkaline Phosphatase 103 38 - 126 U/L   Total Bilirubin 0.5 0.3 - 1.2 mg/dL   GFR  calc non Af Amer >60 >60 mL/min   GFR calc Af Amer >60 >60 mL/min   Anion gap 7 5 - 15  Lipase, blood     Status: None   Collection Time: 11/06/15  2:20 PM  Result Value Ref Range   Lipase 20 11 - 51 U/L  Ferritin     Status: Abnormal   Collection Time: 11/06/15  2:20 PM  Result Value Ref Range   Ferritin 9 (L) 11 - 307 ng/mL  Vitamin B12     Status: None   Collection Time: 11/06/15  2:20 PM  Result Value Ref Range   Vitamin B-12 280 180 - 914 pg/mL  Ultrasound reveals multiple gallstones, but normal CBD and no evidence of cholecystitis.   A: IUP at 33 4/7 weeks with epigastric pain possibly due to cholelithiasis. No evidence of pancreatitis. Iron deficiency anemia  P: Discussed resuming prenatal vitamins.  Will call to restart iron (ferratin not back until after patient left) RX for Protonix 40 mgm BID x 2 weeks then one daily Consult for GI and general surgery as an outpatient Encouraged a low fat diet. [facility]  Farrel ConnersGUTIERREZ, Celestia Duva, CNM

## 2015-11-06 NOTE — Discharge Instructions (Signed)

## 2015-11-06 NOTE — OB Triage Note (Signed)
Elinor Dodge. Guiterrez CNM in room to talk with pt and give pt prescription.

## 2015-11-06 NOTE — H&P (Signed)
11/06/2015 13:35 Primary OBGYN: [facility]/ transfer from [facility]  Chief Complaint: upper abdominal pain since midnight (13hrs PTA) History of Present Illness  24 y.o. G2P1001 @ 3490w4d (Dating: EDC 12/20/15, by 5wk u/s), with the above CC. Pregnancy complicated by: [place] trait, GBS bacteruria, h/o THC use.  [name] states that she last ate at 8-9PM last night and the pain began in her upper abdominal/epigastric area at 12 midnight. The pain is constant and she rates the pain at 10/10.The epigastric pain has been accompanied by pain in her thoracic back bilaterally and by heartburn. She denies nausea, vomiting, diarrhea or fever. Denies loss of appetite, but has not eaten or drunk today. Has not taken any analgesics for pain. Sitting up makes the pain worse. Denies dysuria, hematuria or any injury or fall. No vaginal bleeding. Review of Systems: her 12 point review of systems is negative or as noted in the History of Present Illness.  PMHx:  Past Medical History  Diagnosis Date  . Sickle cell trait (HCC)    PSHx: No past surgical history on file. Medications: none. Except Tylenol prn  Allergies:  No Known Allergies. OBHx:  OB History  Gravida Para Term Preterm AB SAB TAB Ectopic Multiple Living  2         1    # Outcome Date GA Lbr Len/2nd Weight Sex Delivery Anes PTL Lv  2 Current           1 Gravida 08/16/09    F    Y      GYNHx:  History of abnormal pap smears: No. History of STIs: No..  FHx: No family history on file. Soc Hx:  Social History   Social History  . Marital Status: Single    Spouse Name: N/A  . Number of Children: one  . Years of Education: N/A   Occupational History  . Not on file.   Social History Main Topics  . Smoking status: Never Smoker   . Smokeless tobacco: Not on file  . Alcohol Use: No  . Drug Use: No  . Sexual Activity: Not on  file   Other Topics Concern  . Not on file   Social History Narrative    Objective  General: quiet, gravid female, in NAD. Alert, cooperative, answering questions appropriately                                     Temp(Src) 99 F (37.2 C) (Oral)  Resp 16  LMP 03/25/2015 (Exact Date) EFM: 135-140 baseline with accelerations to 150s Toco: some uterine irritability initially  General: Well nourished, well developed Black  female in no acute distress.  Skin: Warm and dry.  Cardiovascular: Regular rate and rhythm. No murmur Respiratory: Clear to auscultation bilateral. Normal respiratory effort Abdomen: gravid, cephalic presentation on Leopold's. Tenderness in epigastric area, BS active Back: bil mild CVAT Neuro/Psych: Normal mood and affect         Perinatal info  O POS/ Rubella Immune / Varicella Not immune/RPR neg/HIV Negative/HepB Surf Ag Negative/TDaP: needed / Flu shot: needed/pap needed PP/  Assessment & Plan  24 y.o. G2P1001 @ 5290w4d with epigastric and thoracic back pain IUP: rNST CBC, CMP, lipase and UA Abdominal ultrasound IV  Annisa Mazzarella, CNM [facility]

## 2015-11-08 LAB — URINE CULTURE

## 2015-11-20 ENCOUNTER — Encounter: Payer: Self-pay | Admitting: *Deleted

## 2015-11-20 ENCOUNTER — Observation Stay
Admission: EM | Admit: 2015-11-20 | Discharge: 2015-11-20 | Disposition: A | Discharge status: Home / Self Care | Payer: Medicaid Other | Attending: Obstetrics and Gynecology | Admitting: Obstetrics and Gynecology

## 2015-11-20 ENCOUNTER — Observation Stay: Payer: Medicaid Other

## 2015-11-20 DIAGNOSIS — D696 Thrombocytopenia, unspecified: Secondary | ICD-10-CM | POA: Diagnosis present

## 2015-11-20 DIAGNOSIS — R1013 Epigastric pain: Secondary | ICD-10-CM | POA: Insufficient documentation

## 2015-11-20 DIAGNOSIS — Z3A35 35 weeks gestation of pregnancy: Secondary | ICD-10-CM | POA: Insufficient documentation

## 2015-11-20 DIAGNOSIS — K8020 Calculus of gallbladder without cholecystitis without obstruction: Secondary | ICD-10-CM | POA: Diagnosis not present

## 2015-11-20 DIAGNOSIS — O26899 Other specified pregnancy related conditions, unspecified trimester: Secondary | ICD-10-CM

## 2015-11-20 DIAGNOSIS — O26893 Other specified pregnancy related conditions, third trimester: Secondary | ICD-10-CM | POA: Diagnosis not present

## 2015-11-20 DIAGNOSIS — Z79899 Other long term (current) drug therapy: Secondary | ICD-10-CM | POA: Diagnosis not present

## 2015-11-20 DIAGNOSIS — O99113 Other diseases of the blood and blood-forming organs and certain disorders involving the immune mechanism complicating pregnancy, third trimester: Secondary | ICD-10-CM

## 2015-11-20 DIAGNOSIS — D573 Sickle-cell trait: Secondary | ICD-10-CM | POA: Insufficient documentation

## 2015-11-20 DIAGNOSIS — R109 Unspecified abdominal pain: Secondary | ICD-10-CM

## 2015-11-20 HISTORY — DX: Calculus of gallbladder without cholecystitis without obstruction: K80.20

## 2015-11-20 LAB — COMPREHENSIVE METABOLIC PANEL
ALK PHOS: 129 U/L — AB (ref 38–126)
ALT: 9 U/L — AB (ref 14–54)
AST: 26 U/L (ref 15–41)
Albumin: 2.7 g/dL — ABNORMAL LOW (ref 3.5–5.0)
Anion gap: 6 (ref 5–15)
CALCIUM: 8.4 mg/dL — AB (ref 8.9–10.3)
CHLORIDE: 110 mmol/L (ref 101–111)
CO2: 22 mmol/L (ref 22–32)
CREATININE: 0.47 mg/dL (ref 0.44–1.00)
Glucose, Bld: 75 mg/dL (ref 65–99)
Potassium: 3.6 mmol/L (ref 3.5–5.1)
Sodium: 138 mmol/L (ref 135–145)
TOTAL PROTEIN: 6 g/dL — AB (ref 6.5–8.1)
Total Bilirubin: 0.7 mg/dL (ref 0.3–1.2)

## 2015-11-20 LAB — URINE DRUG SCREEN, QUALITATIVE (ARMC ONLY)
AMPHETAMINES, UR SCREEN: NOT DETECTED
Barbiturates, Ur Screen: NOT DETECTED
Benzodiazepine, Ur Scrn: NOT DETECTED
CANNABINOID 50 NG, UR ~~LOC~~: NOT DETECTED
Cocaine Metabolite,Ur ~~LOC~~: NOT DETECTED
MDMA (ECSTASY) UR SCREEN: NOT DETECTED
METHADONE SCREEN, URINE: NOT DETECTED
Opiate, Ur Screen: NOT DETECTED
Phencyclidine (PCP) Ur S: NOT DETECTED
TRICYCLIC, UR SCREEN: NOT DETECTED

## 2015-11-20 LAB — FOLATE: FOLATE: 16.9 ng/mL (ref >5.9)

## 2015-11-20 LAB — URINALYSIS COMPLETE WITH MICROSCOPIC (ARMC ONLY)
BILIRUBIN URINE: NEGATIVE
Glucose, UA: NEGATIVE mg/dL
HGB URINE DIPSTICK: NEGATIVE
LEUKOCYTES UA: NEGATIVE
Nitrite: NEGATIVE
PH: 6 (ref 5.0–8.0)
PROTEIN: NEGATIVE mg/dL
Specific Gravity, Urine: 1.011 (ref 1.005–1.030)

## 2015-11-20 LAB — CBC WITH DIFFERENTIAL/PLATELET
BASOS ABS: 0 10*3/uL (ref 0–0.1)
Basophils Relative: 0 %
Eosinophils Absolute: 0.1 10*3/uL (ref 0–0.7)
Eosinophils Relative: 1 %
HCT: 24.2 % — ABNORMAL LOW (ref 35.0–47.0)
Hemoglobin: 8.3 g/dL — ABNORMAL LOW (ref 12.0–16.0)
LYMPHS ABS: 1.4 10*3/uL (ref 1.0–3.6)
Lymphocytes Relative: 18 %
MCH: 28.7 pg (ref 26.0–34.0)
MCHC: 34.4 g/dL (ref 32.0–36.0)
MCV: 83.5 fL (ref 80.0–100.0)
MONO ABS: 0.4 10*3/uL (ref 0.2–0.9)
Monocytes Relative: 5 %
Neutro Abs: 5.6 10*3/uL (ref 1.4–6.5)
Neutrophils Relative %: 76 %
PLATELETS: 145 10*3/uL — AB (ref 150–440)
RBC: 2.9 MIL/uL — AB (ref 3.80–5.20)
RDW: 13.1 % (ref 11.5–14.5)
WBC: 7.5 10*3/uL (ref 3.6–11.0)

## 2015-11-20 LAB — LIPASE, BLOOD: LIPASE: 21 U/L (ref 11–51)

## 2015-11-20 LAB — IRON AND TIBC
Iron: 29 ug/dL (ref 28–170)
SATURATION RATIOS: 6 % — AB (ref 10.4–31.8)
TIBC: 462 ug/dL — AB (ref 250–450)
UIBC: 433 ug/dL

## 2015-11-20 LAB — VITAMIN B12: VITAMIN B 12: 234 pg/mL (ref 180–914)

## 2015-11-20 LAB — RETICULOCYTES
RBC.: 2.91 MIL/uL — AB (ref 3.80–5.20)
Retic Count, Absolute: 64 10*3/uL (ref 19.0–183.0)
Retic Ct Pct: 2.2 % (ref 0.4–3.1)

## 2015-11-20 LAB — AMYLASE: Amylase: 99 U/L (ref 28–100)

## 2015-11-20 LAB — FERRITIN: Ferritin: 6 ng/mL — ABNORMAL LOW (ref 11–307)

## 2015-11-20 MED ORDER — DOCUSATE SODIUM 100 MG PO CAPS: 100.0000 mg | ORAL_CAPSULE | Freq: Two times a day (BID) | ORAL | Status: AC | PRN

## 2015-11-20 MED ORDER — FERROUS GLUCONATE 324 (38 FE) MG PO TABS: 324.0000 mg | ORAL_TABLET | Freq: Two times a day (BID) | ORAL | Status: AC

## 2015-11-20 MED ORDER — ACETAMINOPHEN 325 MG PO TABS: 650.0000 mg | ORAL_TABLET | ORAL | Status: DC | PRN

## 2015-11-20 MED ORDER — LACTATED RINGERS IV SOLN: INTRAVENOUS | Status: DC

## 2015-11-20 MED ORDER — OXYCODONE HCL 5 MG PO TABS
10.0000 mg | ORAL_TABLET | Freq: Once | ORAL | Status: AC
  Administered 2015-11-20: 10 mg via ORAL
  Filled 2015-11-20: qty 2

## 2015-11-20 MED ORDER — OXYCODONE HCL 10 MG PO TABS: 5.0000 mg | ORAL_TABLET | Freq: Once | ORAL | Status: DC

## 2015-11-20 NOTE — OB Triage Note (Signed)
Back pain with abdominal cramping started midnight last night. Constant pain in abdomen with constant pain in upper and lower back. Reports feeling like abdominal pain is from contractions. Has taken tylenol and phenergan at home without relief. Reports being diagnosed with gall stones on last hospital visit. Was to be followed up by referral(s) for care/treatment of gall stones. Patient reports she was never given a referral. Jessica Coffey, Heywood BeneLetitia S

## 2015-11-20 NOTE — Discharge Instructions (Signed)
Cholelithiasis °Cholelithiasis (also called gallstones) is a form of gallbladder disease. The gallbladder is a small organ that helps you digest fats. Symptoms of gallstones are: °· Feeling sick to your stomach (nausea). °· Throwing up (vomiting). °· Belly pain. °· Yellowing of the skin (jaundice). °· Sudden pain. You may feel the pain for minutes to hours. °· Fever. °· Pain to the touch. °HOME CARE °· Only take medicines as told by your doctor. °· Eat a low-fat diet until you see your doctor again. Eating fat can result in pain. °· Follow up with your doctor as told. Attacks usually happen time after time. Surgery is usually needed for permanent treatment. °GET HELP RIGHT AWAY IF:  °· Your pain gets worse. °· Your pain is not helped by medicines. °· You have a fever and lasting symptoms for more than 2-3 days. °· You have a fever and your symptoms suddenly get worse. °· You keep feeling sick to your stomach and throwing up. °MAKE SURE YOU:  °· Understand these instructions. °· Will watch your condition. °· Will get help right away if you are not doing well or get worse. °  °This information is not intended to replace advice given to you by your health care provider. Make sure you discuss any questions you have with your health care provider. °  °Document Released: 05/14/2008 Document Revised: 07/29/2013 Document Reviewed: 05/20/2013 °Elsevier Interactive Patient Education ©2016 [employer] Inc[employer] ° °

## 2015-11-20 NOTE — H&P (Signed)
Obstetrics Admission History & Physical  11/20/2015 - 12:42 PM Primary OBGYN: [facility]  Chief Complaint: upper abdominal and back pain  History of Present Illness  24 y.o. G2P1001 @ 4135w5d (Dating: EDC 1/10), with the above CC. Pregnancy complicated by: [place] trait, h/o THC use, gallstones, BMI 25, GBS bacteruria  Ms. Bluford Kaufmannomika Benedetti states that starting late morning today she started having upper belly/epigastric pain and some back pain similar to what she had a few weeks ago when she was dx with gallstones. No PTL s/s or decreased FM and no fevers, chills, nausea, vomiting, chest pain, sob, dysuria, hematuria. Last PO was last night. She was set up in clinic to [facility] but the EMR from clinic states that they were unable to get in contact with her and so were mailing her a packet to fill out; the patient states that she didn't receive any information  Review of Systems: her 12 point review of systems is negative or as noted in the History of Present Illness.   PMHx:  Past Medical History  Diagnosis Date  . Sickle cell trait (HCC)   . Gallstones    PSHx: History reviewed. No pertinent past surgical history. Medications: ?PPI (pt states it's a white pill to help with the gallstones), which she was started in clinic  Allergies: has No Known Allergies. OBHx:  OB History  Gravida Para Term Preterm AB SAB TAB Ectopic Multiple Living  2 1 1  0 0 0 0 0 0 1    # Outcome Date GA Lbr Len/2nd Weight Sex Delivery Anes PTL Lv  2 Current           1 Term 08/16/09 5872w0d  7 lb (3.175 kg) F   N Y      GYNHx:  History of abnormal pap smears: No. History of STIs: No..             FHx: History reviewed. No pertinent family history. Soc Hx:  Social History   Social History  . Marital Status: Single    Spouse Name: N/A  . Number of Children: N/A  . Years of Education: N/A   Occupational History  . Not on file.   Social History Main Topics  . Smoking status:  Never Smoker   . Smokeless tobacco: Not on file  . Alcohol Use: No  . Drug Use: No  . Sexual Activity: Yes   Other Topics Concern  . Not on file   Social History Narrative    Objective    Current Vital Signs 24h Vital Sign Ranges  T 98.5 F (36.9 C) Temp  Avg: 98.5 F (36.9 C)  Min: 98.5 F (36.9 C)  Max: 98.5 F (36.9 C)  BP (!) 102/59 mmHg BP  Min: 102/59  Max: 102/59  HR 98 Pulse  Avg: 98  Min: 98  Max: 98  RR (!) 108 Resp  Avg: 108  Min: 108  Max: 108  SaO2    98/RA No Data Recorded       24 Hour I/O Current Shift I/O  Time Ins Outs       EFM: 140 baseline, +accels, no decels, mod var  Toco: quiet  General: Well nourished, well developed female in no acute distress.  Skin:  Warm and dry.  Cardiovascular: Regular rate and rhythm. Respiratory:  Clear to auscultation bilateral. Normal respiratory effort Abdomen: gravid, nttp Back: no CVAT Neuro/Psych:  Normal mood and affect.  SVE: negative  Labs  pending  Radiology pending   Assessment & Plan   24 y.o. G2P1001 @ [redacted]w[redacted]d with possible gallstone issues; pt currently stable *IUP: rNST, okay to come off EFM; low sx for PTL or OB related issues *FEN/GI: CMP, CBC, amylase, lipase, and gallbladder and renal u/s. NPO with MIVF *ID: repeat UCx and U/a. Pt was diagnosed with GBS UTI late September. DDx includes but less likely for appy *Analgesia: no needs *Dispo: pending labs  Cornelia Copa. MD [facility] Pager [phone]

## 2015-11-20 NOTE — Final Progress Note (Signed)
OB Triage Discharge Summary   24 y.o. G2P1001 @ 2030w5d presenting for upper abdominal and back pain.  Fetal status reassuring. EFM showed rNST and negative toc. Triage course: Negative SVE, gallstones still present but no s/s of cholecystitis and right hydro seen but ?pathological significance. U/a negative but Ucx sent  Labs pending: UCx RTC later in the week Disposition: Home   [place] Bingharlie Edison Wollschlager, Montez HagemanJr MD [facility]  Pager: [phone]

## 2015-11-22 LAB — URINE CULTURE

## 2015-12-08 ENCOUNTER — Observation Stay
Admission: EM | Admit: 2015-12-08 | Discharge: 2015-12-08 | Disposition: A | Discharge status: Home / Self Care | Payer: Medicaid Other | Attending: Obstetrics and Gynecology | Admitting: Obstetrics and Gynecology

## 2015-12-08 DIAGNOSIS — Z3493 Encounter for supervision of normal pregnancy, unspecified, third trimester: Principal | ICD-10-CM

## 2015-12-08 MED ORDER — ACETAMINOPHEN 325 MG PO TABS: 650.0000 mg | ORAL_TABLET | ORAL | Status: DC | PRN

## 2015-12-08 NOTE — Progress Notes (Signed)
Pt given d/c instructions and verbalized understanding.   She was then d/c home in stable condition with her family.

## 2015-12-08 NOTE — OB Triage Note (Signed)
C/O contractions started 1700 this evening.  Denies ROM or VB      +FM.   Nausea no vomitting.     Next visit is Jan. 6th with office.

## 2015-12-12 ENCOUNTER — Observation Stay
Admission: EM | Admit: 2015-12-12 | Discharge: 2015-12-13 | Disposition: A | Discharge status: Home / Self Care | Payer: Medicaid Other | Attending: Certified Nurse Midwife | Admitting: Certified Nurse Midwife

## 2015-12-12 DIAGNOSIS — M549 Dorsalgia, unspecified: Secondary | ICD-10-CM | POA: Diagnosis present

## 2015-12-12 DIAGNOSIS — M546 Pain in thoracic spine: Secondary | ICD-10-CM | POA: Insufficient documentation

## 2015-12-12 DIAGNOSIS — O9989 Other specified diseases and conditions complicating pregnancy, childbirth and the puerperium: Secondary | ICD-10-CM

## 2015-12-12 DIAGNOSIS — O99891 Other specified diseases and conditions complicating pregnancy: Secondary | ICD-10-CM | POA: Diagnosis present

## 2015-12-12 DIAGNOSIS — O26893 Other specified pregnancy related conditions, third trimester: Principal | ICD-10-CM | POA: Insufficient documentation

## 2015-12-12 DIAGNOSIS — Z3A38 38 weeks gestation of pregnancy: Secondary | ICD-10-CM | POA: Insufficient documentation

## 2015-12-12 MED ORDER — FLEET ENEMA 7-19 GM/118ML RE ENEM
1.0000 | ENEMA | Freq: Once | RECTAL | Status: AC
  Administered 2015-12-13: 1 via RECTAL

## 2015-12-12 MED ORDER — LACTATED RINGERS IV SOLN: 500.0000 mL | INTRAVENOUS | Status: DC | PRN

## 2015-12-12 NOTE — OB Triage Note (Signed)
[name] here with c/o upper back pain, constant in nature, which started approx. 1 hour ago, she rates the pain 8/10. She denies bleeding, LOF, contractions.

## 2015-12-13 DIAGNOSIS — O26893 Other specified pregnancy related conditions, third trimester: Secondary | ICD-10-CM | POA: Diagnosis not present

## 2015-12-13 LAB — URINALYSIS COMPLETE WITH MICROSCOPIC (ARMC ONLY)
Bacteria, UA: NONE SEEN
Bilirubin Urine: NEGATIVE
Glucose, UA: NEGATIVE mg/dL
Hgb urine dipstick: NEGATIVE
Ketones, ur: NEGATIVE mg/dL
Leukocytes, UA: NEGATIVE
Nitrite: NEGATIVE
Protein, ur: NEGATIVE mg/dL
Specific Gravity, Urine: 1.004 — ABNORMAL LOW (ref 1.005–1.030)
pH: 6 (ref 5.0–8.0)

## 2015-12-13 MED ORDER — ZOLPIDEM TARTRATE 5 MG PO TABS
5.0000 mg | ORAL_TABLET | Freq: Every evening | ORAL | Status: DC | PRN
  Administered 2015-12-13: 5 mg via ORAL
  Filled 2015-12-13: qty 1

## 2015-12-13 NOTE — Final Progress Note (Signed)
Physician Final Progress Note  Patient ID: Jessica Coffey MRN: [id number] DOB/AGE: [birth year] 22 y.o.  Admit date: 12/12/2015 Admitting provider: Nadara Mustardobert P Harris, MD Discharge date: 12/13/2015   Admission Diagnoses: Back pain in third trimester  Discharge Diagnoses:  Active Problems:   Back pain affecting pregnancy in third trimester  Probably musculoskeletal in origin   Consults: None  Significant Findings/ Diagnostic Studies: 25 year old G2 P1001 with EDC=12/20/2015 by a 5 week ultrasound presented at 38.6 weeks with complaints of constant back pain since yesterday. Began when she turned over. Denies any falls or trauma. Rates pain 8/10 and states her"whole back" hurts, but is worse between shoulder blades. Complains also of constipation with last BM 4 days ago. Denies vaginal bleeding, dysuria, fever, chills, nausea. [facility] has been remarkable for recurrent UTIs, anemia, mild gestational thrombocytopenia, and gallstones. Exam revealed tenderness in thoracic and lumbar areas. Urinalysis was negative. Lungs: CTA. Occasional contraction noted. FHR 140 with accelerations to 160s with moderate variability. Cervix was 3/70%/-1 and posterior. Had good results from Fleet's enema.  Procedures: Nonstress test was reactive  Discharge Condition: stable  Disposition: 01-Home or Self Care  Diet: Regular diet  Discharge Activity: Activity as tolerated/ no heavy lifting     Medication List    ASK your doctor about these medications        acetaminophen 500 MG tablet  Commonly known as:  TYLENOL  Take 1,000 mg by mouth every 6 (six) hours as needed for mild pain or moderate pain.     docusate sodium 100 MG capsule  Commonly known as:  COLACE  Take 1 capsule (100 mg total) by mouth 2 (two) times daily as needed for mild constipation.     ferrous gluconate 324 MG tablet  Commonly known as:  FERGON  Take 1 tablet (324 mg total) by mouth 2 (two)  times daily with a meal.     prenatal multivitamin Tabs tablet  Take 1 tablet by mouth daily at 12 noon.      Recommended heat to back, massage, Biofreeze, Tylenol. [facility]. Labor precautions.    SignedFarrel Conners: Jessica Coffey 12/13/2015, 8:45 AM

## 2015-12-13 NOTE — Discharge Instructions (Signed)

## 2016-05-20 ENCOUNTER — Emergency Department
Admission: EM | Admit: 2016-05-20 | Discharge: 2016-05-20 | Disposition: A | Discharge status: Home / Self Care | Payer: Medicaid Other | Attending: Emergency Medicine | Admitting: Emergency Medicine

## 2016-05-20 ENCOUNTER — Encounter: Payer: Self-pay | Admitting: Emergency Medicine

## 2016-05-20 DIAGNOSIS — R110 Nausea: Secondary | ICD-10-CM | POA: Diagnosis present

## 2016-05-20 DIAGNOSIS — Z5321 Procedure and treatment not carried out due to patient leaving prior to being seen by health care provider: Secondary | ICD-10-CM | POA: Diagnosis not present

## 2016-05-20 LAB — URINALYSIS COMPLETE WITH MICROSCOPIC (ARMC ONLY)
BILIRUBIN URINE: NEGATIVE
GLUCOSE, UA: NEGATIVE mg/dL
KETONES UR: NEGATIVE mg/dL
Leukocytes, UA: NEGATIVE
Nitrite: POSITIVE — AB
PROTEIN: NEGATIVE mg/dL
Specific Gravity, Urine: 1.016 (ref 1.005–1.030)
pH: 6 (ref 5.0–8.0)

## 2016-05-20 LAB — POCT PREGNANCY, URINE: Preg Test, Ur: POSITIVE — AB

## 2016-05-20 MED ORDER — ONDANSETRON 4 MG PO TBDP
4.0000 mg | ORAL_TABLET | Freq: Once | ORAL | Status: AC | PRN
Start: 2016-05-20 — End: 2016-05-20
  Administered 2016-05-20: 4 mg via ORAL
  Filled 2016-05-20: qty 1

## 2016-05-20 NOTE — ED Notes (Signed)
Pt states that she doesn't want to stay to be seen, pt states that she was told that her [place] is being taken to a hospital in [place] and her ride is on the way, pt told that the dr is waiting to see her, pt states that she is leaving

## 2016-05-20 NOTE — ED Provider Notes (Signed)
Patient left prior to being seen  Jene Everyobert Raife Lizer, MD 05/20/16 1818

## 2016-05-20 NOTE — ED Notes (Signed)
Pt states that she has been very nauseated for the past 3 days, denies vomiting or diarrhea, denies abd pain, denies any other symptoms, pt states that nothing she does makes the nausea better and is having it all day long, states that no one else has been sick in her home

## 2016-07-16 ENCOUNTER — Encounter: Payer: Self-pay | Admitting: Emergency Medicine

## 2016-07-16 ENCOUNTER — Emergency Department
Admission: EM | Admit: 2016-07-16 | Discharge: 2016-07-16 | Disposition: A | Discharge status: Home / Self Care | Payer: Medicaid Other | Attending: Emergency Medicine | Admitting: Emergency Medicine

## 2016-07-16 DIAGNOSIS — Z791 Long term (current) use of non-steroidal anti-inflammatories (NSAID): Secondary | ICD-10-CM | POA: Diagnosis not present

## 2016-07-16 DIAGNOSIS — O4692 Antepartum hemorrhage, unspecified, second trimester: Secondary | ICD-10-CM | POA: Diagnosis present

## 2016-07-16 DIAGNOSIS — O200 Threatened abortion: Secondary | ICD-10-CM | POA: Insufficient documentation

## 2016-07-16 DIAGNOSIS — Z3A16 16 weeks gestation of pregnancy: Secondary | ICD-10-CM | POA: Insufficient documentation

## 2016-07-16 DIAGNOSIS — O2342 Unspecified infection of urinary tract in pregnancy, second trimester: Secondary | ICD-10-CM | POA: Insufficient documentation

## 2016-07-16 DIAGNOSIS — R55 Syncope and collapse: Secondary | ICD-10-CM

## 2016-07-16 DIAGNOSIS — Z79899 Other long term (current) drug therapy: Secondary | ICD-10-CM | POA: Insufficient documentation

## 2016-07-16 LAB — URINALYSIS COMPLETE WITH MICROSCOPIC (ARMC ONLY)
Bilirubin Urine: NEGATIVE
GLUCOSE, UA: NEGATIVE mg/dL
Ketones, ur: NEGATIVE mg/dL
Nitrite: POSITIVE — AB
PH: 5 (ref 5.0–8.0)
Protein, ur: NEGATIVE mg/dL
SPECIFIC GRAVITY, URINE: 1.016 (ref 1.005–1.030)

## 2016-07-16 LAB — BASIC METABOLIC PANEL
Anion gap: 7 (ref 5–15)
BUN: 7 mg/dL (ref 6–20)
CALCIUM: 9.2 mg/dL (ref 8.9–10.3)
CHLORIDE: 104 mmol/L (ref 101–111)
CO2: 23 mmol/L (ref 22–32)
CREATININE: 0.43 mg/dL — AB (ref 0.44–1.00)
GFR calc non Af Amer: >60 mL/min (ref >60)
Glucose, Bld: 76 mg/dL (ref 65–99)
Potassium: 3.3 mmol/L — ABNORMAL LOW (ref 3.5–5.1)
SODIUM: 134 mmol/L — AB (ref 135–145)

## 2016-07-16 LAB — CBC
HCT: 29.4 % — ABNORMAL LOW (ref 35.0–47.0)
Hemoglobin: 10.4 g/dL — ABNORMAL LOW (ref 12.0–16.0)
MCH: 29.5 pg (ref 26.0–34.0)
MCHC: 35.4 g/dL (ref 32.0–36.0)
MCV: 83.3 fL (ref 80.0–100.0)
PLATELETS: 199 10*3/uL (ref 150–440)
RBC: 3.53 MIL/uL — AB (ref 3.80–5.20)
RDW: 15.8 % — ABNORMAL HIGH (ref 11.5–14.5)
WBC: 6.3 10*3/uL (ref 3.6–11.0)

## 2016-07-16 LAB — HCG, QUANTITATIVE, PREGNANCY: hCG, Beta Chain, Quant, S: 71169 m[IU]/mL — ABNORMAL HIGH (ref <5)

## 2016-07-16 MED ORDER — NITROFURANTOIN MONOHYD MACRO 100 MG PO CAPS
100.0000 mg | ORAL_CAPSULE | Freq: Two times a day (BID) | ORAL | 0 refills | Status: AC
Start: 2016-07-16 — End: 2016-07-23

## 2016-07-16 NOTE — ED Triage Notes (Signed)
States she is dizzy today  Denies any n/v   Pt is 4 months preg and had some spotting today

## 2016-07-16 NOTE — ED Provider Notes (Signed)
[facility] Emergency Department Provider Note  Time seen: 5:31 PM  I have reviewed the triage vital signs and the nursing notes.   HISTORY  Chief Complaint Dizziness and Vaginal Bleeding    HPI Jessica Coffey is a 25 y.o. female with a past medical history of sickle cell trait, approximate [redacted] weeks pregnant G3 P2 who presents to the emergency departmentwith dizziness. According to the patient she was at the store today when she began feeling dizzy like she was going to pass out. States earlier this morning she had a small amount of spotting as well with some lower abdominal discomfort. Denies any bleeding currently. Denies abdominal pain currently.   Past Medical History:  Diagnosis Date  . Gallstones   . Sickle cell trait [facility](HCC)     Patient Active Problem List   Diagnosis Date Noted  . Back pain affecting pregnancy in third trimester 12/12/2015  . Supervision of normal pregnancy in third trimester 12/08/2015  . Benign gestational thrombocytopenia in third trimester (HCC) 11/20/2015  . Abdominal pain in pregnancy 11/06/2015  . Abdominal pain affecting pregnancy, antepartum 10/18/2015  . Abdominal pain affecting pregnancy 09/12/2015    History reviewed. No pertinent surgical history.  Prior to Admission medications   Medication Sig Start Date End Date Taking? Authorizing Provider  acetaminophen (TYLENOL) 500 MG tablet Take 1,000 mg by mouth every 6 (six) hours as needed for mild pain or moderate pain.    Historical Provider, MD  docusate sodium (COLACE) 100 MG capsule Take 1 capsule (100 mg total) by mouth 2 (two) times daily as needed for mild constipation. 11/20/15   [place] Bingharlie Pickens, MD  ferrous gluconate (FERGON) 324 MG tablet Take 1 tablet (324 mg total) by mouth 2 (two) times daily with a meal. 11/20/15   [place] Bingharlie Pickens, MD  Prenatal Vit-Fe Fumarate-FA (PRENATAL MULTIVITAMIN) TABS tablet Take 1 tablet by mouth daily at 12 noon.    Historical  Provider, MD    No Known Allergies  History reviewed. No pertinent family history.  Social History Social History  Substance Use Topics  . Smoking status: Never Smoker  . Smokeless tobacco: Never Used  . Alcohol use No    Review of Systems Constitutional: Negative for fever. Cardiovascular: Negative for chest pain. Respiratory: Negative for shortness of breath. Gastrointestinal: Mild suprapubic pain Genitourinary: Negative for dysuria. Musculoskeletal: Negative for back pain. Skin: Negative for rash. Neurological: Negative for headache 10-point ROS otherwise negative.  ____________________________________________   PHYSICAL EXAM:  VITAL SIGNS: ED Triage Vitals [07/16/16 1534]  Enc Vitals Group     BP 105/63     Pulse Rate 93     Resp 20     Temp 98 F (36.7 C)     Temp Source Oral     SpO2 100 %     Weight 125 lb (56.7 kg)     Height 5\' 5"  (1.651 m)     Head Circumference      Peak Flow      Pain Score      Pain Loc      Pain Edu?      Excl. in GC?     Constitutional: Alert and oriented. Well appearing and in no distress. Eyes: Normal exam ENT   Head: Normocephalic and atraumatic.   Mouth/Throat: Mucous membranes are moist. Cardiovascular: Normal rate, regular rhythm. No murmur Respiratory: Normal respiratory effort without tachypnea nor retractions. Breath sounds are clear  Gastrointestinal: Soft, mild suprapubic tenderness to palpation, no rebound  guarding. No distention. Musculoskeletal: Nontender with normal range of motion in all extremities.  Neurologic:  Normal speech and language. No gross focal neurologic deficits Skin:  Skin is warm, dry and intact.  Psychiatric: Mood and affect are normal. Speech and behavior are normal.   ____________________________________________   INITIAL IMPRESSION / ASSESSMENT AND PLAN / ED COURSE  Pertinent labs & imaging results that were available during my care of the patient were reviewed by me and  considered in my medical decision making (see chart for details).  Patient presents the emergency department with dizziness and near syncope. Patient's labs are largely within normal limits, besides a significant urinary tract infection. We'll place the patient on Macrobid. We'll perform a bedside ultrasound to further evaluate. Patient sees [facility], but did not call them today. Patient is O+ on record review.  Bedside ultrasound shows good fetal movement. Heart rate around 150 bpm on M-mode. We will discharge home with OB follow-up. Patient agreeable plan.  ____________________________________________   FINAL CLINICAL IMPRESSION(S) / ED DIAGNOSES  Threatened miscarriage Urinary tract infection Dizziness    [name], MD 07/16/16 1806

## 2016-07-20 ENCOUNTER — Emergency Department
Admission: EM | Admit: 2016-07-20 | Discharge: 2016-07-20 | Disposition: A | Discharge status: Home / Self Care | Payer: Medicaid Other | Attending: Emergency Medicine | Admitting: Emergency Medicine

## 2016-07-20 ENCOUNTER — Encounter: Payer: Self-pay | Admitting: Emergency Medicine

## 2016-07-20 DIAGNOSIS — Z79899 Other long term (current) drug therapy: Secondary | ICD-10-CM | POA: Insufficient documentation

## 2016-07-20 DIAGNOSIS — L509 Urticaria, unspecified: Secondary | ICD-10-CM | POA: Diagnosis present

## 2016-07-20 MED ORDER — DIPHENHYDRAMINE HCL 50 MG/ML IJ SOLN
50.0000 mg | Freq: Once | INTRAMUSCULAR | Status: AC
  Administered 2016-07-20: 50 mg via INTRAMUSCULAR
  Filled 2016-07-20: qty 1

## 2016-07-20 MED ORDER — METHYLPREDNISOLONE SODIUM SUCC 125 MG IJ SOLR
125.0000 mg | Freq: Once | INTRAMUSCULAR | Status: AC
Start: 2016-07-20 — End: 2016-07-20
  Administered 2016-07-20: 125 mg via INTRAMUSCULAR
  Filled 2016-07-20: qty 2

## 2016-07-20 MED ORDER — FAMOTIDINE 20 MG PO TABS
20.0000 mg | ORAL_TABLET | Freq: Once | ORAL | Status: AC
  Administered 2016-07-20: 20 mg via ORAL
  Filled 2016-07-20: qty 1

## 2016-07-20 MED ORDER — PREDNISONE 50 MG PO TABS: 50.0000 mg | ORAL_TABLET | Freq: Every day | ORAL | 0 refills | Status: DC

## 2016-07-20 NOTE — ED Triage Notes (Addendum)
Pt reports face broke out in hives 15 min ago. Denies any exposure to bug bites, or other contact with any allergies that she is aware of. Denies SHOB or CP. Is lightheaded from burning sensation a/w hives.

## 2016-07-20 NOTE — ED Provider Notes (Signed)
[facility] Emergency Department Provider Note  ____________________________________________  Time seen: Approximately 5:44 PM  I have reviewed the triage vital signs and the nursing notes.   HISTORY  Chief Complaint Urticaria    HPI Jessica Coffey is a 25 y.o. female who presents emergency department complaining of facial he urticaria. Patient states that she was at the grocery store when she noticed her face become itchy/burning. She was in the mirror and saw "a rash." Patient denies any new medications, foods, ED products, soap or shampoo, detergents. Patient is not taking any medication prior to arrival. She denies any ocular involvement, oral involvement, difficulty breathing or speaking.  Patient is pregnant but denies any abdominal pain, vaginal bleeding or discharge.   Past Medical History:  Diagnosis Date  . Gallstones   . Sickle cell trait [facility])     Patient Active Problem List   Diagnosis Date Noted  . Back pain affecting pregnancy in third trimester 12/12/2015  . Supervision of normal pregnancy in third trimester 12/08/2015  . Benign gestational thrombocytopenia in third trimester (HCC) 11/20/2015  . Abdominal pain in pregnancy 11/06/2015  . Abdominal pain affecting pregnancy, antepartum 10/18/2015  . Abdominal pain affecting pregnancy 09/12/2015    History reviewed. No pertinent surgical history.  Prior to Admission medications   Medication Sig Start Date End Date Taking? Authorizing Provider  acetaminophen (TYLENOL) 500 MG tablet Take 1,000 mg by mouth every 6 (six) hours as needed for mild pain or moderate pain.    Historical Provider, MD  docusate sodium (COLACE) 100 MG capsule Take 1 capsule (100 mg total) by mouth 2 (two) times daily as needed for mild constipation. 11/20/15   [place] Bing, MD  ferrous gluconate (FERGON) 324 MG tablet Take 1 tablet (324 mg total) by mouth 2 (two) times daily with a meal. 11/20/15   [place] Bing,  MD  nitrofurantoin, macrocrystal-monohydrate, (MACROBID) 100 MG capsule Take 1 capsule (100 mg total) by mouth 2 (two) times daily. 07/16/16 07/23/16  [name], MD  predniSONE (DELTASONE) 50 MG tablet Take 1 tablet (50 mg total) by mouth daily with breakfast. 07/20/16   Delorise Royals Shone Leventhal, PA-C  Prenatal Vit-Fe Fumarate-FA (PRENATAL MULTIVITAMIN) TABS tablet Take 1 tablet by mouth daily at 12 noon.    Historical Provider, MD    Allergies Review of patient's allergies indicates no known allergies.  History reviewed. No pertinent family history.  Social History Social History  Substance Use Topics  . Smoking status: Never Smoker  . Smokeless tobacco: Never Used  . Alcohol use No     Review of Systems  Constitutional: No fever/chills Eyes: No visual changes. No discharge ENT: No upper respiratory complaints. Cardiovascular: no chest pain. Respiratory: no cough. No SOB. Gastrointestinal: No abdominal pain.  No nausea, no vomiting.  No diarrhea.  No constipation. Musculoskeletal: Negative for musculoskeletal pain. Skin: Negative for rash, abrasions, lacerations, ecchymosis. Neurological: Negative for headaches, focal weakness or numbness. 10-point ROS otherwise negative.  ____________________________________________   PHYSICAL EXAM:  VITAL SIGNS: ED Triage Vitals [07/20/16 1726]  Enc Vitals Group     BP (!) 96/54     Pulse Rate 99     Resp 14     Temp 98.4 F (36.9 C)     Temp Source Oral     SpO2 100 %     Weight 125 lb (56.7 kg)     Height  (1.651 m)     Head Circumference      Peak Flow  Pain Score      Pain Loc      Pain Edu?      Excl. in GC?      Constitutional: Alert and oriented. Well appearing and in no acute distress. Eyes: Conjunctivae are normal. PERRL. EOMI. Head: Atraumatic. ENT:      Ears:       Nose: No congestion/rhinnorhea.      Mouth/Throat: Mucous membranes are moist. Oropharynx is nonerythematous and nonedematous. Uvula  is midline. Neck: No stridor.    Cardiovascular: Normal rate, regular rhythm. Normal S1 and S2.  Good peripheral circulation. Respiratory: Normal respiratory effort without tachypnea or retractions. Lungs CTAB. Good air entry to the bases with no decreased or absent breath sounds. Musculoskeletal: Full range of motion to all extremities. No gross deformities appreciated. Neurologic:  Normal speech and language. No gross focal neurologic deficits are appreciated.  Skin:  Skin is warm, dry and intact. Urticaria is noted to bilateral cheeks. No ocular involvement. No periorbital involvement. Psychiatric: Mood and affect are normal. Speech and behavior are normal. Patient exhibits appropriate insight and judgement.   ____________________________________________   LABS (all labs ordered are listed, but only abnormal results are displayed)  Labs Reviewed - No data to display ____________________________________________  EKG  EKG reveals normal sinus rhythm at a rate of 95 bpm. No ST elevation or depression noted. PR, QRS, QT intervals within normal limits. No Q waves or delta waves identified. ____________________________________________  RADIOLOGY   No results found.  ____________________________________________    PROCEDURES  Procedure(s) performed:    Procedures    Medications  methylPREDNISolone sodium succinate (SOLU-MEDROL) 125 mg/2 mL injection 125 mg (125 mg Intramuscular Given 07/20/16 1801)  diphenhydrAMINE (BENADRYL) injection 50 mg (50 mg Intramuscular Given 07/20/16 1801)  famotidine (PEPCID) tablet 20 mg (20 mg Oral Given 07/20/16 1801)     ____________________________________________   INITIAL IMPRESSION / ASSESSMENT AND PLAN / ED COURSE  Pertinent labs & imaging results that were available during my care of the patient were reviewed by me and considered in my medical decision making (see chart for details).  Clinical Course    Patient's diagnosis is  consistent with Urticaria. No ocular or oral involvement. Patient is not in distress. Patient is pregnant but denies any abdominal pain, vaginal bleeding or discharge. Patient is given injections of Solu-Medrol, diphenhydramine, and oral famotidine. This improves symptoms.. Patient will be discharged home with prescriptions for steroids for 5 days and patient may take Benadryl as needed.. Patient is to follow up with OB/GYN or primary care as needed or otherwise directed. Patient is given ED precautions to return to the ED for any worsening or new symptoms.     ____________________________________________  FINAL CLINICAL IMPRESSION(S) / ED DIAGNOSES  Final diagnoses:  Urticaria      NEW MEDICATIONS STARTED DURING THIS VISIT:  New Prescriptions   PREDNISONE (DELTASONE) 50 MG TABLET    Take 1 tablet (50 mg total) by mouth daily with breakfast.        This chart was dictated using voice recognition software/Dragon. Despite best efforts to proofread, errors can occur which can change the meaning. Any change was purely unintentional.    Racheal PatchesJonathan D Tasman Zapata, PA-C 07/20/16 1822    Myrna Blazeravid Matthew Schaevitz, MD 07/20/16 2107

## 2016-08-22 ENCOUNTER — Inpatient Hospital Stay
Admission: EM | Admit: 2016-08-22 | Discharge: 2016-08-28 | DRG: 781 | Disposition: A | Discharge status: Home / Self Care | Payer: Medicaid Other | Attending: Obstetrics & Gynecology | Admitting: Obstetrics & Gynecology

## 2016-08-22 DIAGNOSIS — O26612 Liver and biliary tract disorders in pregnancy, second trimester: Principal | ICD-10-CM | POA: Diagnosis present

## 2016-08-22 DIAGNOSIS — Y92234 Operating room of hospital as the place of occurrence of the external cause: Secondary | ICD-10-CM | POA: Diagnosis present

## 2016-08-22 DIAGNOSIS — D573 Sickle-cell trait: Secondary | ICD-10-CM | POA: Diagnosis present

## 2016-08-22 DIAGNOSIS — O99012 Anemia complicating pregnancy, second trimester: Secondary | ICD-10-CM | POA: Diagnosis present

## 2016-08-22 DIAGNOSIS — Y658 Other specified misadventures during surgical and medical care: Secondary | ICD-10-CM | POA: Diagnosis present

## 2016-08-22 DIAGNOSIS — O99619 Diseases of the digestive system complicating pregnancy, unspecified trimester: Secondary | ICD-10-CM

## 2016-08-22 DIAGNOSIS — R739 Hyperglycemia, unspecified: Secondary | ICD-10-CM | POA: Diagnosis present

## 2016-08-22 DIAGNOSIS — K8010 Calculus of gallbladder with chronic cholecystitis without obstruction: Secondary | ICD-10-CM | POA: Diagnosis present

## 2016-08-22 DIAGNOSIS — O26899 Other specified pregnancy related conditions, unspecified trimester: Secondary | ICD-10-CM

## 2016-08-22 DIAGNOSIS — Z3A21 21 weeks gestation of pregnancy: Secondary | ICD-10-CM

## 2016-08-22 DIAGNOSIS — R748 Abnormal levels of other serum enzymes: Secondary | ICD-10-CM | POA: Diagnosis present

## 2016-08-22 DIAGNOSIS — O99282 Endocrine, nutritional and metabolic diseases complicating pregnancy, second trimester: Secondary | ICD-10-CM | POA: Diagnosis present

## 2016-08-22 DIAGNOSIS — Z87440 Personal history of urinary (tract) infections: Secondary | ICD-10-CM

## 2016-08-22 DIAGNOSIS — K8020 Calculus of gallbladder without cholecystitis without obstruction: Secondary | ICD-10-CM | POA: Diagnosis present

## 2016-08-22 DIAGNOSIS — O219 Vomiting of pregnancy, unspecified: Secondary | ICD-10-CM | POA: Diagnosis present

## 2016-08-22 DIAGNOSIS — Z3687 Encounter for antenatal screening for uncertain dates: Secondary | ICD-10-CM

## 2016-08-22 DIAGNOSIS — E876 Hypokalemia: Secondary | ICD-10-CM | POA: Diagnosis present

## 2016-08-22 DIAGNOSIS — E8809 Other disorders of plasma-protein metabolism, not elsewhere classified: Secondary | ICD-10-CM | POA: Diagnosis present

## 2016-08-22 DIAGNOSIS — N9972 Accidental puncture and laceration of a genitourinary system organ or structure during other procedure: Secondary | ICD-10-CM | POA: Diagnosis present

## 2016-08-22 DIAGNOSIS — R109 Unspecified abdominal pain: Secondary | ICD-10-CM

## 2016-08-22 DIAGNOSIS — Q441 Other congenital malformations of gallbladder: Secondary | ICD-10-CM

## 2016-08-22 DIAGNOSIS — O0932 Supervision of pregnancy with insufficient antenatal care, second trimester: Secondary | ICD-10-CM

## 2016-08-22 DIAGNOSIS — S3760XA Unspecified injury of uterus, initial encounter: Secondary | ICD-10-CM | POA: Diagnosis not present

## 2016-08-22 HISTORY — DX: Oligohydramnios, unspecified trimester, not applicable or unspecified: O41.00X0

## 2016-08-22 HISTORY — DX: Urinary tract infection, site not specified: N39.0

## 2016-08-22 HISTORY — DX: Anemia, unspecified: D64.9

## 2016-08-22 LAB — LIPASE, BLOOD: Lipase: 22 U/L (ref 11–51)

## 2016-08-22 LAB — CBC
HEMATOCRIT: 26.8 % — AB (ref 35.0–47.0)
Hemoglobin: 9.5 g/dL — ABNORMAL LOW (ref 12.0–16.0)
MCH: 30.4 pg (ref 26.0–34.0)
MCHC: 35.4 g/dL (ref 32.0–36.0)
MCV: 85.9 fL (ref 80.0–100.0)
PLATELETS: 186 10*3/uL (ref 150–440)
RBC: 3.12 MIL/uL — AB (ref 3.80–5.20)
RDW: 13.8 % (ref 11.5–14.5)
WBC: 5.8 10*3/uL (ref 3.6–11.0)

## 2016-08-22 LAB — DIFFERENTIAL
Basophils Absolute: 0 10*3/uL (ref 0–0.1)
Basophils Relative: 0 %
EOS PCT: 4 %
Eosinophils Absolute: 0.2 10*3/uL (ref 0–0.7)
LYMPHS PCT: 25 %
Lymphs Abs: 1.4 10*3/uL (ref 1.0–3.6)
MONO ABS: 0.3 10*3/uL (ref 0.2–0.9)
MONOS PCT: 6 %
Neutro Abs: 3.8 10*3/uL (ref 1.4–6.5)
Neutrophils Relative %: 65 %

## 2016-08-22 LAB — URINE DRUG SCREEN, QUALITATIVE (ARMC ONLY)
Amphetamines, Ur Screen: NOT DETECTED
Barbiturates, Ur Screen: NOT DETECTED
Benzodiazepine, Ur Scrn: NOT DETECTED
CANNABINOID 50 NG, UR ~~LOC~~: POSITIVE — AB
COCAINE METABOLITE, UR ~~LOC~~: NOT DETECTED
MDMA (ECSTASY) UR SCREEN: NOT DETECTED
Methadone Scn, Ur: NOT DETECTED
Opiate, Ur Screen: NOT DETECTED
PHENCYCLIDINE (PCP) UR S: NOT DETECTED
Tricyclic, Ur Screen: NOT DETECTED

## 2016-08-22 LAB — CHLAMYDIA/NGC RT PCR (ARMC ONLY)
CHLAMYDIA TR: NOT DETECTED
N GONORRHOEAE: NOT DETECTED

## 2016-08-22 LAB — COMPREHENSIVE METABOLIC PANEL
ALBUMIN: 3.2 g/dL — AB (ref 3.5–5.0)
ALT: 10 U/L — ABNORMAL LOW (ref 14–54)
ANION GAP: 6 (ref 5–15)
AST: 17 U/L (ref 15–41)
Alkaline Phosphatase: 40 U/L (ref 38–126)
BILIRUBIN TOTAL: 0.2 mg/dL — AB (ref 0.3–1.2)
BUN: 7 mg/dL (ref 6–20)
CHLORIDE: 106 mmol/L (ref 101–111)
CO2: 25 mmol/L (ref 22–32)
Calcium: 9.1 mg/dL (ref 8.9–10.3)
Creatinine, Ser: 0.49 mg/dL (ref 0.44–1.00)
GFR calc Af Amer: >60 mL/min (ref >60)
GFR calc non Af Amer: >60 mL/min (ref >60)
Glucose, Bld: 85 mg/dL (ref 65–99)
POTASSIUM: 3.8 mmol/L (ref 3.5–5.1)
SODIUM: 137 mmol/L (ref 135–145)
Total Protein: 6.4 g/dL — ABNORMAL LOW (ref 6.5–8.1)

## 2016-08-22 LAB — URINALYSIS COMPLETE WITH MICROSCOPIC (ARMC ONLY)
Bacteria, UA: NONE SEEN
Bilirubin Urine: NEGATIVE
GLUCOSE, UA: NEGATIVE mg/dL
Hgb urine dipstick: NEGATIVE
KETONES UR: NEGATIVE mg/dL
NITRITE: NEGATIVE
Protein, ur: NEGATIVE mg/dL
SPECIFIC GRAVITY, URINE: 1.014 (ref 1.005–1.030)
pH: 6 (ref 5.0–8.0)

## 2016-08-22 LAB — RAPID HIV SCREEN (HIV 1/2 AB+AG)
HIV 1/2 Antibodies: NONREACTIVE
HIV-1 P24 ANTIGEN - HIV24: NONREACTIVE

## 2016-08-22 LAB — AMYLASE: Amylase: 129 U/L — ABNORMAL HIGH (ref 28–100)

## 2016-08-22 MED ORDER — ONDANSETRON HCL 4 MG/2ML IJ SOLN
4.0000 mg | Freq: Four times a day (QID) | INTRAMUSCULAR | Status: DC | PRN
  Administered 2016-08-23 (×3): 4 mg via INTRAVENOUS
  Filled 2016-08-22 (×3): qty 2

## 2016-08-22 MED ORDER — MORPHINE SULFATE (PF) 4 MG/ML IV SOLN
4.0000 mg | Freq: Four times a day (QID) | INTRAVENOUS | Status: DC | PRN
  Administered 2016-08-23 (×2): 4 mg via INTRAVENOUS
  Filled 2016-08-22 (×2): qty 1

## 2016-08-22 MED ORDER — LACTATED RINGERS IV SOLN
INTRAVENOUS | Status: DC
  Administered 2016-08-23 – 2016-08-25 (×9): via INTRAVENOUS

## 2016-08-23 ENCOUNTER — Encounter: Payer: Self-pay | Admitting: Obstetrics and Gynecology

## 2016-08-23 ENCOUNTER — Observation Stay: Payer: Medicaid Other

## 2016-08-23 DIAGNOSIS — R109 Unspecified abdominal pain: Secondary | ICD-10-CM | POA: Diagnosis not present

## 2016-08-23 DIAGNOSIS — K8020 Calculus of gallbladder without cholecystitis without obstruction: Secondary | ICD-10-CM | POA: Diagnosis not present

## 2016-08-23 DIAGNOSIS — O219 Vomiting of pregnancy, unspecified: Secondary | ICD-10-CM | POA: Diagnosis not present

## 2016-08-23 DIAGNOSIS — O9989 Other specified diseases and conditions complicating pregnancy, childbirth and the puerperium: Secondary | ICD-10-CM | POA: Diagnosis not present

## 2016-08-23 DIAGNOSIS — O26612 Liver and biliary tract disorders in pregnancy, second trimester: Secondary | ICD-10-CM | POA: Diagnosis not present

## 2016-08-23 LAB — TYPE AND SCREEN
ABO/RH(D): O POS
ANTIBODY SCREEN: NEGATIVE

## 2016-08-23 MED ORDER — ACETAMINOPHEN 325 MG PO TABS
650.0000 mg | ORAL_TABLET | ORAL | Status: DC | PRN
  Administered 2016-08-28: 650 mg via ORAL
  Filled 2016-08-23: qty 2

## 2016-08-23 MED ORDER — CALCIUM CARBONATE ANTACID 500 MG PO CHEW: 2.0000 | CHEWABLE_TABLET | ORAL | Status: DC | PRN

## 2016-08-23 MED ORDER — ZOLPIDEM TARTRATE 5 MG PO TABS: 5.0000 mg | ORAL_TABLET | Freq: Every evening | ORAL | Status: DC | PRN

## 2016-08-23 MED ORDER — PRENATAL MULTIVITAMIN CH
1.0000 | ORAL_TABLET | Freq: Every day | ORAL | Status: DC
  Administered 2016-08-23 – 2016-08-28 (×5): 1 via ORAL
  Filled 2016-08-23 (×5): qty 1

## 2016-08-23 MED ORDER — MORPHINE SULFATE (PF) 2 MG/ML IV SOLN
2.0000 mg | INTRAVENOUS | Status: DC | PRN
  Administered 2016-08-23 – 2016-08-24 (×8): 2 mg via INTRAVENOUS
  Filled 2016-08-23 (×8): qty 1

## 2016-08-23 MED ORDER — ONDANSETRON HCL 4 MG/2ML IJ SOLN
4.0000 mg | INTRAMUSCULAR | Status: DC | PRN
  Administered 2016-08-26 (×3): 4 mg via INTRAVENOUS
  Filled 2016-08-23 (×4): qty 2

## 2016-08-23 MED ORDER — DOCUSATE SODIUM 100 MG PO CAPS
100.0000 mg | ORAL_CAPSULE | Freq: Every day | ORAL | Status: DC
  Administered 2016-08-23 – 2016-08-28 (×5): 100 mg via ORAL
  Filled 2016-08-23 (×5): qty 1

## 2016-08-23 NOTE — Progress Notes (Signed)
S: Pain in the RUQ which is a 6 out of 10 on 1-10 scale after the Morphine. +nausea. No further vomiting. Past Medical History:  Diagnosis Date  . Anemia   . Gallstones   . Oligohydramnios   . Sickle cell trait (HCC)   . UTI (lower urinary tract infection)   History reviewed. No pertinent surgical history.  History reviewed. No pertinent family history.  Social History   Social History  . Marital status: Single    Spouse name: N/A  . Number of children: N/A  . Years of education: N/A   Occupational History  . Not on file.   Social History Main Topics  . Smoking status: Never Smoker  . Smokeless tobacco: Never Used  . Alcohol use No  . Drug use: No  . Sexual activity: Yes   Other Topics Concern  . Not on file   Social History Narrative  . No narrative on file   Vitals:   08/23/16 0435 08/23/16 0801  BP: (!) 94/51 (!) 95/50  Pulse: 68 78  Resp: 20 18  Temp: 98 F (36.7 C) 98 F (36.7 C)   Gen: 25 yo black female in NAD but, still having RUQ pain HEENT: Eyes non-icteric. Normocephalic. Lungs: CTA bilat, no W/R/R. Heart: S1S2, RRR, No M/R/g. Abd; TTP over the RUQ area to midline. Soft, flat, NT in lower quadrants. +BS present. Gravid approx 20 weeks. Extrems: neg Psoas sign, no edema noted. A: IUP at 20 weeks 2. RUQ pain due to cholelethiasis with multiple gallstones 3. Nausea and vomiting P: Dr Excell Seltzerooper called and aware and will do a surgical consult. Pt going to US for prenatal anatomy scan.  Pt has had no prior OB care and [facility] at this point. Lab Results  Component Value Date   LIPASE 22 08/22/2016   CBC Latest Ref Rng & Units 08/22/2016 07/16/2016 11/20/2015  WBC 3.6 - 11.0 K/uL 5.8 6.3 7.5  Hemoglobin 12.0 - 16.0 g/dL 1.6(X9.5(L) 10.4(L) 8.3(L)  Hematocrit 35.0 - 47.0 % 26.8(L) 29.4(L) 24.2(L)  Platelets 150 - 440 K/uL 186 199 145(L)  Elevated amylase Will continue to hydrate and give pain meds and wait to have Dr  Excell Seltzerooper evalute pt. He was contacted by me and agreed to see pt.

## 2016-08-23 NOTE — Consult Note (Signed)
Surgical Consultation  08/23/2016  Jessica Coffey is an 25 y.o. female.   CC: Right upper quadrant pain  HPI: This a patient with right upper quadrant pain it has been waxing and waning for the last 24-36 hours. She has had this happen before most notably when she was pregnant the first time. This is her second child. She is [redacted] weeks pregnant which I need to verify. She is definitely in the second trimester. She's had extensive nausea does not feel like eating but is only vomited once yesterday. She denies jaundice or acholic stools does not have any back pain.  She has not had any surgical history and no C-section in the past.  Past Medical History:  Diagnosis Date  . Anemia   . Gallstones   . Oligohydramnios   . Sickle cell trait ([place])   . UTI (lower urinary tract infection)     History reviewed. No pertinent surgical history.  History reviewed. No pertinent family history.  Social History:  reports that she has never smoked. She has never used smokeless tobacco. She reports that she does not drink alcohol or use drugs.  Allergies: No Known Allergies  Medications reviewed.   Review of Systems:   Review of Systems  Constitutional: Negative for chills and fever.  HENT: Negative.   Eyes: Negative.   Respiratory: Negative.   Cardiovascular: Negative.   Gastrointestinal: Positive for abdominal pain, nausea and vomiting. Negative for blood in stool, constipation, diarrhea, heartburn and melena.  Genitourinary: Negative.   Musculoskeletal: Negative.   Skin: Negative.   Neurological: Negative.   Endo/Heme/Allergies: Negative.   Psychiatric/Behavioral: Negative.      Physical Exam:  BP (!) 96/51 (BP Location: Right Arm)   Pulse 66   Temp 98.1 F (36.7 C) (Oral)   Resp 18   LMP 03/24/2016 (LMP Unknown)   Physical Exam  Constitutional: She is oriented to person, place, and time and well-developed, well-nourished, and in no distress. No distress.  HENT:  Head:  Normocephalic and atraumatic.  Eyes: Right eye exhibits no discharge. Left eye exhibits no discharge. No scleral icterus.  Neck: Normal range of motion.  Cardiovascular: Normal rate, regular rhythm and normal heart sounds.   Pulmonary/Chest: Effort normal and breath sounds normal. No respiratory distress. She has no wheezes. She has no rales.  Abdominal: Soft. She exhibits no distension. There is tenderness. There is no rebound and no guarding.  Palpable uterus fundus just below the umbilicus. Abdomen is soft and minimally tender in the right upper quadrant no guarding no rebound  Musculoskeletal: Normal range of motion. She exhibits no edema or tenderness.  Lymphadenopathy:    She has no cervical adenopathy.  Neurological: She is alert and oriented to person, place, and time.  Skin: Skin is warm and dry. No rash noted. She is not diaphoretic. No erythema.  Psychiatric: Mood and affect normal.  Vitals reviewed.     Results for orders placed or performed during the hospital encounter of 08/22/16 (from the past 48 hour(s))  Urinalysis complete, with microscopic ([facility] only)     Status: Abnormal   Collection Time: 08/22/16  8:46 PM  Result Value Ref Range   Color, Urine YELLOW (A) YELLOW   APPearance HAZY (A) CLEAR   Glucose, UA NEGATIVE NEGATIVE mg/dL   Bilirubin Urine NEGATIVE NEGATIVE   Ketones, ur NEGATIVE NEGATIVE mg/dL   Specific Gravity, Urine 1.014 1.005 - 1.030   Hgb urine dipstick NEGATIVE NEGATIVE   pH 6.0 5.0 - 8.0  Protein, ur NEGATIVE NEGATIVE mg/dL   Nitrite NEGATIVE NEGATIVE   Leukocytes, UA TRACE (A) NEGATIVE   RBC / HPF 0-5 0 - 5 RBC/hpf   WBC, UA 0-5 0 - 5 WBC/hpf   Bacteria, UA NONE SEEN NONE SEEN   Squamous Epithelial / LPF 0-5 (A) NONE SEEN   Mucous PRESENT   Chlamydia/NGC rt PCR ([facility] only)     Status: None   Collection Time: 08/22/16  8:46 PM  Result Value Ref Range   Specimen source GC/Chlam URINE, RANDOM    Chlamydia Tr NOT DETECTED NOT DETECTED   N  gonorrhoeae NOT DETECTED NOT DETECTED    Comment: (NOTE) 100  This methodology has not been evaluated in pregnant women or in 200  patients with a history of hysterectomy. 300 400  This methodology will not be performed on patients less than 67  years of age.   Urine Drug Screen, Qualitative ([facility] only)     Status: Abnormal   Collection Time: 08/22/16  9:09 PM  Result Value Ref Range   Tricyclic, Ur Screen NONE DETECTED NONE DETECTED   Amphetamines, Ur Screen NONE DETECTED NONE DETECTED   MDMA (Ecstasy)Ur Screen NONE DETECTED NONE DETECTED   Cocaine Metabolite,Ur [place] NONE DETECTED NONE DETECTED   Opiate, Ur Screen NONE DETECTED NONE DETECTED   Phencyclidine (PCP) Ur S NONE DETECTED NONE DETECTED   Cannabinoid 50 Ng, Ur [place] POSITIVE (A) NONE DETECTED   Barbiturates, Ur Screen NONE DETECTED NONE DETECTED   Benzodiazepine, Ur Scrn NONE DETECTED NONE DETECTED   Methadone Scn, Ur NONE DETECTED NONE DETECTED    Comment: (NOTE) 161  Tricyclics, urine               Cutoff 1000 ng/mL 200  Amphetamines, urine             Cutoff 1000 ng/mL 300  MDMA (Ecstasy), urine           Cutoff 500 ng/mL 400  Cocaine Metabolite, urine       Cutoff 300 ng/mL 500  Opiate, urine                   Cutoff 300 ng/mL 600  Phencyclidine (PCP), urine      Cutoff 25 ng/mL 700  Cannabinoid, urine              Cutoff 50 ng/mL 800  Barbiturates, urine             Cutoff 200 ng/mL 900  Benzodiazepine, urine           Cutoff 200 ng/mL 1000 Methadone, urine                Cutoff 300 ng/mL 1100 1200 The urine drug screen provides only a preliminary, unconfirmed 1300 analytical test result and should not be used for non-medical 1400 purposes. Clinical consideration and professional judgment should 1500 be applied to any positive drug screen result due to possible 1600 interfering substances. A more specific alternate chemical method 1700 must be used in order to obtain a confirmed analytical result.  1800 Gas  chromato graphy / mass spectrometry (GC/MS) is the preferred 1900 confirmatory method.   CBC     Status: Abnormal   Collection Time: 08/22/16  9:42 PM  Result Value Ref Range   WBC 5.8 3.6 - 11.0 K/uL   RBC 3.12 (L) 3.80 - 5.20 MIL/uL   Hemoglobin 9.5 (L) 12.0 - 16.0 g/dL   HCT 26.8 (L) 35.0 - 47.0 %  MCV 85.9 80.0 - 100.0 fL   MCH 30.4 26.0 - 34.0 pg   MCHC 35.4 32.0 - 36.0 g/dL   RDW 13.8 11.5 - 14.5 %   Platelets 186 150 - 440 K/uL  Differential     Status: None   Collection Time: 08/22/16  9:42 PM  Result Value Ref Range   Neutrophils Relative % 65 %   Neutro Abs 3.8 1.4 - 6.5 K/uL   Lymphocytes Relative 25 %   Lymphs Abs 1.4 1.0 - 3.6 K/uL   Monocytes Relative 6 %   Monocytes Absolute 0.3 0.2 - 0.9 K/uL   Eosinophils Relative 4 %   Eosinophils Absolute 0.2 0 - 0.7 K/uL   Basophils Relative 0 %   Basophils Absolute 0.0 0 - 0.1 K/uL  Rapid HIV screen (HIV 1/2 Ab+Ag)     Status: None   Collection Time: 08/22/16  9:42 PM  Result Value Ref Range   HIV-1 P24 Antigen - HIV24 NON REACTIVE NON REACTIVE   HIV 1/2 Antibodies NON REACTIVE NON REACTIVE   Interpretation (HIV Ag Ab)      A non reactive test result means that HIV 1 or HIV 2 antibodies and HIV 1 p24 antigen were not detected in the specimen.  Type and screen [place]     Status: None   Collection Time: 08/22/16  9:42 PM  Result Value Ref Range   ABO/RH(D) O POS    Antibody Screen NEG    Sample Expiration 08/25/2016   Comprehensive metabolic panel     Status: Abnormal   Collection Time: 08/22/16  9:42 PM  Result Value Ref Range   Sodium 137 135 - 145 mmol/L   Potassium 3.8 3.5 - 5.1 mmol/L   Chloride 106 101 - 111 mmol/L   CO2 25 22 - 32 mmol/L   Glucose, Bld 85 65 - 99 mg/dL   BUN 7 6 - 20 mg/dL   Creatinine, Ser 0.49 0.44 - 1.00 mg/dL   Calcium 9.1 8.9 - 10.3 mg/dL   Total Protein 6.4 (L) 6.5 - 8.1 g/dL   Albumin 3.2 (L) 3.5 - 5.0 g/dL   AST 17 15 - 41 U/L   ALT 10 (L) 14 - 54 U/L    Alkaline Phosphatase 40 38 - 126 U/L   Total Bilirubin 0.2 (L) 0.3 - 1.2 mg/dL   GFR calc non Af Amer >60 >60 mL/min   GFR calc Af Amer >60 >60 mL/min    Comment: (NOTE) The eGFR has been calculated using the CKD EPI equation. This calculation has not been validated in all clinical situations. eGFR's persistently <60 mL/min signify possible Chronic Kidney Disease.    Anion gap 6 5 - 15  Amylase     Status: Abnormal   Collection Time: 08/22/16  9:42 PM  Result Value Ref Range   Amylase 129 (H) 28 - 100 U/L  Lipase, blood     Status: None   Collection Time: 08/22/16  9:42 PM  Result Value Ref Range   Lipase 22 11 - 51 U/L   US Abdomen Limited Ruq  Result Date: 08/23/2016 CLINICAL DATA:  Nausea and vomiting.  Right upper quadrant pain. EXAM: US ABDOMEN LIMITED - RIGHT UPPER QUADRANT COMPARISON:  None. FINDINGS: Gallbladder: Multiple mobile gallstones measuring up to 6 mm. The patient was not tender to probe pressure over the gallbladder. No gallbladder mural thickening. No pericholecystic fluid. Common bile duct: Diameter: 2.3 mm Liver: No focal lesion identified. Within  normal limits in parenchymal echogenicity. Mild right renal hydronephrosis, typical for second trimester. IMPRESSION: Cholelithiasis without sonographic evidence of cholecystitis. Electronically Signed   By: Andreas Newport M.D.   On: 08/23/2016 01:35    Assessment/Plan:  Labs and ultrasound personally reviewed. This a patient who is 19 or [redacted] weeks pregnant. She has had nausea and a single emesis but 24-36 hours of abdominal pain in the right upper quadrant. I would like to give her a trial of clear liquids and if her pain improves at all over the next 24 hours and we can avoid surgery at this point however she has several weeks left in her pregnancy and if she is to have surgery the second trimester is the best time and I will reevaluate this after she take some liquids surgical intervention could be performed safely at  this point as opposed to waiting until she is postpartum which may not be feasible. Patient was understanding and agreeable. I discussed this with nursing.  Florene Glen, MD, FACS

## 2016-08-23 NOTE — Progress Notes (Signed)
S: I am still hurting in the upper abd and have vomited 2 x and cannot keep anything down. My pain is still present but, I can sleep on and off. Pt has been getting Zofran for the nausea. Pt could not keep po down at all.  O; Gen: 25 yo black female with mod discomfort when awake. Pt cannot eat or drink. Abd: When applying any pressure on the RUQ, pt has mod pain to palpation and says she hurts.  Last MS was at 8pm and she is tol longer periods in between the pain med but, pt has not made progress with fluids. A: 1. IUP at 19 weeks 2. Cholelethiais P; 1. NPO since pt has vomited x 2 and cannot tolerate liquids 2. Will increase the Zofran to q 4 hrs to cover her N7V. 3. Dr Excell Seltzerooper was to re-evaluate pt this pm but, has not seen the pt yet. He will be seeing the pt in the am.

## 2016-08-23 NOTE — H&P (Signed)
FACULTY PRACTICE ANTEPARTUM ADMISSION HISTORY AND PHYSICAL NOTE   History of Present Illness: Jessica Coffey is a 25 y.o. G3P1001 at 21+4 weeks by UNKNOWN LMP of 03/24/16 with c/o upper abdominal pain that started this afternoon.  She describes the pain as "coming and going" and it starts in her mid back and wraps around to the front in upper abdomen.  She reports positive fetal movement.  She has had 1 large vomiting episode.  She reports nausea.  She denies SOB, CP, Constipation, diarrhea, dysuria, urgency, frequency, cramping, abnormal vaginal discharge, vaginal bleeding, or LOF.  She has not received prenatal care with this pregnancy.  She delivered her last baby in December 14, 2015 at [facility].     Patient Active Problem List   Diagnosis Date Noted  . Cholelithiasis affecting pregnancy in second trimester, antepartum 08/23/2016  . Back pain affecting pregnancy in third trimester 12/12/2015  . Supervision of normal pregnancy in third trimester 12/08/2015  . Benign gestational thrombocytopenia in third trimester (HCC) 11/20/2015  . Abdominal pain in pregnancy 11/06/2015  . Abdominal pain affecting pregnancy, antepartum 10/18/2015  . Abdominal pain affecting pregnancy 09/12/2015    Past Medical History:  Diagnosis Date  . Gallstones   . Sickle cell trait (HCC)     History reviewed. No pertinent surgical history.  OB History  Gravida Para Term Preterm AB Living  3 2 2  0 0 2  SAB TAB Ectopic Multiple Live Births  0 0 0 0 2    # Outcome Date GA Lbr Len/2nd Weight Sex Delivery Anes PTL Lv  3 Current           2 Term 12/14/15 [redacted]w[redacted]d  3.43 kg (7 lb 9 oz) M Vag-Spont  N LIV  1 Term 08/16/09 [redacted]w[redacted]d  3.175 kg (7 lb) F   N LIV    Obstetric Comments  12/14/15: Induction of labor at 39 weeks for oligohydramnios     Social History   Social History  . Marital status: Single    Spouse name: N/A  . Number of children: N/A  . Years of education: N/A   Social History Main Topics  . Smoking  status: Never Smoker  . Smokeless tobacco: Never Used  . Alcohol use No  . Drug use: No  . Sexual activity: Yes   Other Topics Concern  . None   Social History Narrative  . None    History reviewed. No pertinent family history.  No Known Allergies  Prescriptions Prior to Admission  Medication Sig Dispense Refill Last Dose  . acetaminophen (TYLENOL) 500 MG tablet Take 1,000 mg by mouth every 6 (six) hours as needed for mild pain or moderate pain.   08/22/2016 at Unknown time  . Prenatal Vit-Fe Fumarate-FA (PRENATAL MULTIVITAMIN) TABS tablet Take 1 tablet by mouth daily at 12 noon.   08/22/2016 at Unknown time  . docusate sodium (COLACE) 100 MG capsule Take 1 capsule (100 mg total) by mouth 2 (two) times daily as needed for mild constipation. (Patient not taking: Reported on 08/22/2016) 60 capsule 1 Not Taking at Unknown time  . ferrous gluconate (FERGON) 324 MG tablet Take 1 tablet (324 mg total) by mouth 2 (two) times daily with a meal. (Patient not taking: Reported on 08/22/2016) 60 tablet 3 Not Taking at Unknown time  . predniSONE (DELTASONE) 50 MG tablet Take 1 tablet (50 mg total) by mouth daily with breakfast. (Patient not taking: Reported on 08/22/2016) 5 tablet 0 Completed Course at Unknown time  Review of Systems - Negative except : Gastrointestinal ROS: positive for - nausea/vomiting and Upper abdominal pain + mid back pain +HA   Vitals:  BP (!) 91/42   Pulse 64   Temp 98 F (36.7 C) (Oral)   Resp 20   LMP 03/24/2016  Physical Examination: CONSTITUTIONAL: Well-developed, well-nourished female in no acute distress.  HENT:  Normocephalic, atraumatic, External right and left ear normal. Oropharynx is clear and moist EYES: Conjunctivae and EOM are normal. Pupils are equal, round, and reactive to light. No scleral icterus.  NECK: Normal range of motion, supple, no masses SKIN: Skin is warm and dry. No rash noted. Not diaphoretic. No erythema. No pallor. NEUROLGIC: Alert  and oriented to person, place, and time. Normal reflexes, muscle tone coordination. No cranial nerve deficit noted. PSYCHIATRIC: Normal mood and affect. Normal behavior. Normal judgment and thought content. CARDIOVASCULAR: Normal heart rate noted, regular rhythm RESPIRATORY: Effort and breath sounds normal, no problems with respiration noted ABDOMEN: Soft,nondistended, +significant RUQ tenderness on palpation, epigastric tenderness, and LUQ tenderness Uterus: gravid. Fundal height: 20cm  MUSCULOSKELETAL: Normal range of motion. No edema and no tenderness. 2+ distal pulses. Cervix: deferred  Fetal Monitoring: Doppler 163 bpm Tocometer: occ UI  Labs:  Results for orders placed or performed during the hospital encounter of 08/22/16 (from the past 24 hour(s))  Chlamydia/NGC rt PCR ([facility] only)   Collection Time: 08/22/16  8:46 PM  Result Value Ref Range   Specimen source GC/Chlam URINE, RANDOM    Chlamydia Tr NOT DETECTED NOT DETECTED   N gonorrhoeae NOT DETECTED NOT DETECTED  Urinalysis complete, with microscopic ([facility] only)   Collection Time: 08/22/16  8:46 PM  Result Value Ref Range   Color, Urine YELLOW (A) YELLOW   APPearance HAZY (A) CLEAR   Glucose, UA NEGATIVE NEGATIVE mg/dL   Bilirubin Urine NEGATIVE NEGATIVE   Ketones, ur NEGATIVE NEGATIVE mg/dL   Specific Gravity, Urine 1.014 1.005 - 1.030   Hgb urine dipstick NEGATIVE NEGATIVE   pH 6.0 5.0 - 8.0   Protein, ur NEGATIVE NEGATIVE mg/dL   Nitrite NEGATIVE NEGATIVE   Leukocytes, UA TRACE (A) NEGATIVE   RBC / HPF 0-5 0 - 5 RBC/hpf   WBC, UA 0-5 0 - 5 WBC/hpf   Bacteria, UA NONE SEEN NONE SEEN   Squamous Epithelial / LPF 0-5 (A) NONE SEEN   Mucous PRESENT   Urine Drug Screen, Qualitative ([facility] only)   Collection Time: 08/22/16  9:09 PM  Result Value Ref Range   Tricyclic, Ur Screen NONE DETECTED NONE DETECTED   Amphetamines, Ur Screen NONE DETECTED NONE DETECTED   MDMA (Ecstasy)Ur Screen NONE DETECTED NONE DETECTED    Cocaine Metabolite,Ur [place] NONE DETECTED NONE DETECTED   Opiate, Ur Screen NONE DETECTED NONE DETECTED   Phencyclidine (PCP) Ur S NONE DETECTED NONE DETECTED   Cannabinoid 50 Ng, Ur [place] POSITIVE (A) NONE DETECTED   Barbiturates, Ur Screen NONE DETECTED NONE DETECTED   Benzodiazepine, Ur Scrn NONE DETECTED NONE DETECTED   Methadone Scn, Ur NONE DETECTED NONE DETECTED  CBC   Collection Time: 08/22/16  9:42 PM  Result Value Ref Range   WBC 5.8 3.6 - 11.0 K/uL   RBC 3.12 (L) 3.80 - 5.20 MIL/uL   Hemoglobin 9.5 (L) 12.0 - 16.0 g/dL   HCT 16.1 (L) 09.6 - 04.5 %   MCV 85.9 80.0 - 100.0 fL   MCH 30.4 26.0 - 34.0 pg   MCHC 35.4 32.0 - 36.0 g/dL   RDW  13.8 11.5 - 14.5 %   Platelets 186 150 - 440 K/uL  Differential   Collection Time: 08/22/16  9:42 PM  Result Value Ref Range   Neutrophils Relative % 65 %   Neutro Abs 3.8 1.4 - 6.5 K/uL   Lymphocytes Relative 25 %   Lymphs Abs 1.4 1.0 - 3.6 K/uL   Monocytes Relative 6 %   Monocytes Absolute 0.3 0.2 - 0.9 K/uL   Eosinophils Relative 4 %   Eosinophils Absolute 0.2 0 - 0.7 K/uL   Basophils Relative 0 %   Basophils Absolute 0.0 0 - 0.1 K/uL  Rapid HIV screen (HIV 1/2 Ab+Ag)   Collection Time: 08/22/16  9:42 PM  Result Value Ref Range   HIV-1 P24 Antigen - HIV24 NON REACTIVE NON REACTIVE   HIV 1/2 Antibodies NON REACTIVE NON REACTIVE   Interpretation (HIV Ag Ab)      A non reactive test result means that HIV 1 or HIV 2 antibodies and HIV 1 p24 antigen were not detected in the specimen.  Comprehensive metabolic panel   Collection Time: 08/22/16  9:42 PM  Result Value Ref Range   Sodium 137 135 - 145 mmol/L   Potassium 3.8 3.5 - 5.1 mmol/L   Chloride 106 101 - 111 mmol/L   CO2 25 22 - 32 mmol/L   Glucose, Bld 85 65 - 99 mg/dL   BUN 7 6 - 20 mg/dL   Creatinine, Ser 1.61 0.44 - 1.00 mg/dL   Calcium 9.1 8.9 - 09.6 mg/dL   Total Protein 6.4 (L) 6.5 - 8.1 g/dL   Albumin 3.2 (L) 3.5 - 5.0 g/dL   AST 17 15 - 41 U/L   ALT 10 (L) 14 - 54 U/L    Alkaline Phosphatase 40 38 - 126 U/L   Total Bilirubin 0.2 (L) 0.3 - 1.2 mg/dL   GFR calc non Af Amer >60 >60 mL/min   GFR calc Af Amer >60 >60 mL/min   Anion gap 6 5 - 15  Amylase   Collection Time: 08/22/16  9:42 PM  Result Value Ref Range   Amylase 129 (H) 28 - 100 U/L  Lipase, blood   Collection Time: 08/22/16  9:42 PM  Result Value Ref Range   Lipase 22 11 - 51 U/L  Type and screen [facility]   Collection Time: 08/22/16  9:42 PM  Result Value Ref Range   ABO/RH(D) O POS    Antibody Screen NEG    Sample Expiration 08/25/2016     Imaging Studies: US Abdomen Limited Ruq  Result Date: 08/23/2016 CLINICAL DATA:  Nausea and vomiting.  Right upper quadrant pain. EXAM: US ABDOMEN LIMITED - RIGHT UPPER QUADRANT COMPARISON:  None. FINDINGS: Gallbladder: Multiple mobile gallstones measuring up to 6 mm. The patient was not tender to probe pressure over the gallbladder. No gallbladder mural thickening. No pericholecystic fluid. Common bile duct: Diameter: 2.3 mm Liver: No focal lesion identified. Within normal limits in parenchymal echogenicity. Mild right renal hydronephrosis, typical for second trimester. IMPRESSION: Cholelithiasis without sonographic evidence of cholecystitis. Electronically Signed   By: Ellery Plunk M.D.   On: 08/23/2016 01:35     Assessment and Plan: Patient Active Problem List   Diagnosis Date Noted  . Cholelithiasis affecting pregnancy in second trimester, antepartum 08/23/2016  . Back pain affecting pregnancy in third trimester 12/12/2015  . Supervision of normal pregnancy in third trimester 12/08/2015  . Benign gestational thrombocytopenia in third trimester (HCC) 11/20/2015  . Abdominal  pain in pregnancy 11/06/2015  . Abdominal pain affecting pregnancy, antepartum 10/18/2015  . Abdominal pain affecting pregnancy 09/12/2015   1. Admit to Mother/Baby Unit for overnight observation at 21+4 weeks      - Insert PIV       - LR @  125/hr      - Routine prenatal labs       - RUQ pain: CMP, amylase, lipase       - Doppler every 8 hours       - NPO 2. Cholelithiasis during 2nd trimester without cholecystitis:      - Elevated Amylase, normal Lipase, normal LFTs     - Stat Right Upper Quadrant US      - Morphine 4mg  IVP every 6 hours PRN for pain     - Zofran 4mg  IVP every 6 hours PRN for nausea  3. Scheduled for Routine Anatomy Scan in AM 4. Will re-evaluate status in AM  Dr. Dalbert GarnetBeasley consulted/collaborated care - updated and agrees with plan  Carlean JewsMeredith Juanya Villavicencio, CNM

## 2016-08-24 ENCOUNTER — Observation Stay: Payer: Medicaid Other | Admitting: Certified Registered Nurse Anesthetist

## 2016-08-24 ENCOUNTER — Encounter: Payer: Self-pay | Admitting: *Deleted

## 2016-08-24 ENCOUNTER — Encounter
Admission: EM | Disposition: A | Discharge status: Home / Self Care | Payer: Self-pay | Source: Home / Self Care | Attending: Obstetrics & Gynecology

## 2016-08-24 DIAGNOSIS — R109 Unspecified abdominal pain: Secondary | ICD-10-CM | POA: Diagnosis not present

## 2016-08-24 DIAGNOSIS — O26612 Liver and biliary tract disorders in pregnancy, second trimester: Secondary | ICD-10-CM | POA: Diagnosis not present

## 2016-08-24 DIAGNOSIS — O9989 Other specified diseases and conditions complicating pregnancy, childbirth and the puerperium: Secondary | ICD-10-CM | POA: Diagnosis not present

## 2016-08-24 DIAGNOSIS — O219 Vomiting of pregnancy, unspecified: Secondary | ICD-10-CM | POA: Diagnosis not present

## 2016-08-24 DIAGNOSIS — K8020 Calculus of gallbladder without cholecystitis without obstruction: Secondary | ICD-10-CM | POA: Diagnosis not present

## 2016-08-24 HISTORY — PX: CHOLECYSTECTOMY: SHX55

## 2016-08-24 LAB — RUBELLA SCREEN: Rubella: 4.71 index (ref >0.99)

## 2016-08-24 LAB — HEPATITIS B SURFACE ANTIGEN: Hepatitis B Surface Ag: NEGATIVE

## 2016-08-24 LAB — VARICELLA ZOSTER ANTIBODY, IGG: Varicella IgG: <135 index — ABNORMAL LOW (ref >165)

## 2016-08-24 LAB — RPR: RPR Ser Ql: NONREACTIVE

## 2016-08-24 SURGERY — LAPAROSCOPIC CHOLECYSTECTOMY
Anesthesia: General

## 2016-08-24 MED ORDER — FENTANYL CITRATE (PF) 100 MCG/2ML IJ SOLN
INTRAMUSCULAR | Status: AC
  Administered 2016-08-24: 25 ug via INTRAVENOUS
  Filled 2016-08-24: qty 2

## 2016-08-24 MED ORDER — CEFAZOLIN SODIUM-DEXTROSE 2-4 GM/100ML-% IV SOLN
2.0000 g | INTRAVENOUS | Status: AC
  Administered 2016-08-24: 2 g via INTRAVENOUS
  Filled 2016-08-24: qty 100

## 2016-08-24 MED ORDER — BUPIVACAINE-EPINEPHRINE (PF) 0.25% -1:200000 IJ SOLN
INTRAMUSCULAR | Status: DC | PRN
  Administered 2016-08-24: 15 mL

## 2016-08-24 MED ORDER — CEFAZOLIN SODIUM-DEXTROSE 2-4 GM/100ML-% IV SOLN
2.0000 g | Freq: Four times a day (QID) | INTRAVENOUS | Status: AC
  Administered 2016-08-24 – 2016-08-25 (×4): 2 g via INTRAVENOUS
  Filled 2016-08-24 (×4): qty 100

## 2016-08-24 MED ORDER — EPHEDRINE SULFATE 50 MG/ML IJ SOLN
INTRAMUSCULAR | Status: DC | PRN
  Administered 2016-08-24 (×2): 10 mg via INTRAVENOUS

## 2016-08-24 MED ORDER — PROPOFOL 10 MG/ML IV BOLUS
INTRAVENOUS | Status: DC | PRN
  Administered 2016-08-24: 130 mg via INTRAVENOUS
  Administered 2016-08-24: 50 mg via INTRAVENOUS

## 2016-08-24 MED ORDER — ACETAMINOPHEN 10 MG/ML IV SOLN
1000.0000 mg | Freq: Four times a day (QID) | INTRAVENOUS | Status: AC
  Administered 2016-08-25 (×4): 1000 mg via INTRAVENOUS
  Filled 2016-08-24 (×4): qty 100

## 2016-08-24 MED ORDER — FENTANYL CITRATE (PF) 100 MCG/2ML IJ SOLN
25.0000 ug | INTRAMUSCULAR | Status: DC | PRN
  Administered 2016-08-24 (×5): 25 ug via INTRAVENOUS

## 2016-08-24 MED ORDER — ONDANSETRON HCL 4 MG/2ML IJ SOLN: 4.0000 mg | Freq: Once | INTRAMUSCULAR | Status: DC | PRN

## 2016-08-24 MED ORDER — MORPHINE SULFATE (PF) 4 MG/ML IV SOLN
4.0000 mg | INTRAVENOUS | Status: DC | PRN
  Administered 2016-08-24 – 2016-08-25 (×6): 4 mg via INTRAVENOUS
  Filled 2016-08-24 (×5): qty 1

## 2016-08-24 MED ORDER — PHENYLEPHRINE HCL 10 MG/ML IJ SOLN
INTRAMUSCULAR | Status: DC | PRN
  Administered 2016-08-24: 100 ug via INTRAVENOUS

## 2016-08-24 MED ORDER — SUCCINYLCHOLINE CHLORIDE 20 MG/ML IJ SOLN
INTRAMUSCULAR | Status: DC | PRN
  Administered 2016-08-24: 80 mg via INTRAVENOUS

## 2016-08-24 MED ORDER — FENTANYL CITRATE (PF) 100 MCG/2ML IJ SOLN
INTRAMUSCULAR | Status: DC | PRN
  Administered 2016-08-24 (×4): 50 ug via INTRAVENOUS

## 2016-08-24 MED ORDER — MORPHINE SULFATE (PF) 4 MG/ML IV SOLN
INTRAVENOUS | Status: AC
  Administered 2016-08-24: 4 mg via INTRAVENOUS
  Filled 2016-08-24: qty 1

## 2016-08-24 MED ORDER — BUPIVACAINE-EPINEPHRINE (PF) 0.25% -1:200000 IJ SOLN
INTRAMUSCULAR | Status: AC
  Filled 2016-08-24: qty 30

## 2016-08-24 MED ORDER — ONDANSETRON HCL 4 MG/2ML IJ SOLN
INTRAMUSCULAR | Status: DC | PRN
  Administered 2016-08-24: 4 mg via INTRAVENOUS

## 2016-08-24 MED ORDER — LIDOCAINE HCL (CARDIAC) 20 MG/ML IV SOLN
INTRAVENOUS | Status: DC | PRN
  Administered 2016-08-24: 60 mg via INTRAVENOUS

## 2016-08-24 SURGICAL SUPPLY — 47 items
ADHESIVE MASTISOL STRL (MISCELLANEOUS) IMPLANT
APPLIER CLIP ROT 10 11.4 M/L (STAPLE) ×3
BLADE SURG SZ11 CARB STEEL (BLADE) ×3 IMPLANT
CANISTER SUCT 1200ML W/VALVE (MISCELLANEOUS) ×3 IMPLANT
CATH CHOLANGI 4FR 420404F (CATHETERS) IMPLANT
CHLORAPREP W/TINT 26ML (MISCELLANEOUS) ×3 IMPLANT
CLIP APPLIE ROT 10 11.4 M/L (STAPLE) ×1 IMPLANT
CLOSURE WOUND 1/2 X4 (GAUZE/BANDAGES/DRESSINGS) ×1
CONRAY 60ML FOR OR (MISCELLANEOUS) IMPLANT
DRAPE C-ARM XRAY 36X54 (DRAPES) IMPLANT
DRSG OPSITE POSTOP 4X10 (GAUZE/BANDAGES/DRESSINGS) ×3 IMPLANT
ELECT REM PT RETURN 9FT ADLT (ELECTROSURGICAL) ×3
ELECTRODE REM PT RTRN 9FT ADLT (ELECTROSURGICAL) ×1 IMPLANT
ENDOPOUCH RETRIEVER 10 (MISCELLANEOUS) ×3 IMPLANT
GAUZE SPONGE NON-WVN 2X2 STRL (MISCELLANEOUS) ×4 IMPLANT
GLOVE BIO SURGEON STRL SZ8 (GLOVE) ×3 IMPLANT
GOWN STRL REUS W/ TWL LRG LVL3 (GOWN DISPOSABLE) ×4 IMPLANT
GOWN STRL REUS W/TWL LRG LVL3 (GOWN DISPOSABLE) ×8
IRRIGATION STRYKERFLOW (MISCELLANEOUS) IMPLANT
IRRIGATOR STRYKERFLOW (MISCELLANEOUS)
IV CATH ANGIO 12GX3 LT BLUE (NEEDLE) ×3 IMPLANT
IV NS 1000ML (IV SOLUTION) ×2
IV NS 1000ML BAXH (IV SOLUTION) ×1 IMPLANT
JACKSON PRATT 10 (INSTRUMENTS) IMPLANT
KIT RM TURNOVER STRD PROC AR (KITS) ×3 IMPLANT
LABEL OR SOLS (LABEL) ×3 IMPLANT
NDL SAFETY 22GX1.5 (NEEDLE) ×3 IMPLANT
NEEDLE VERESS 14GA 120MM (NEEDLE) ×3 IMPLANT
NS IRRIG 500ML POUR BTL (IV SOLUTION) ×6 IMPLANT
PACK LAP CHOLECYSTECTOMY (MISCELLANEOUS) ×3 IMPLANT
SCISSORS METZENBAUM CVD 33 (INSTRUMENTS) ×3 IMPLANT
SET YANKAUER POOLE SUCT (MISCELLANEOUS) ×3 IMPLANT
SLEEVE ENDOPATH XCEL 5M (ENDOMECHANICALS) ×6 IMPLANT
SPONGE EXCIL AMD DRAIN 4X4 6P (MISCELLANEOUS) IMPLANT
SPONGE LAP 18X18 5 PK (GAUZE/BANDAGES/DRESSINGS) ×6 IMPLANT
SPONGE VERSALON 2X2 STRL (MISCELLANEOUS) ×8
STRIP CLOSURE SKIN 1/2X4 (GAUZE/BANDAGES/DRESSINGS) ×2 IMPLANT
SUT MNCRL 4-0 (SUTURE) ×2
SUT MNCRL 4-0 27XMFL (SUTURE) ×1
SUT PDS 2-0 27IN (SUTURE) ×9 IMPLANT
SUT VICRYL 0 AB UR-6 (SUTURE) ×3 IMPLANT
SUTURE MNCRL 4-0 27XMF (SUTURE) ×1 IMPLANT
SYR 20CC LL (SYRINGE) ×3 IMPLANT
TRAY FOLEY W/METER SILVER 16FR (SET/KITS/TRAYS/PACK) ×3 IMPLANT
TROCAR XCEL NON-BLD 11X100MML (ENDOMECHANICALS) ×3 IMPLANT
TROCAR XCEL NON-BLD 5MMX100MML (ENDOMECHANICALS) ×3 IMPLANT
TUBING INSUFFLATOR HI FLOW (MISCELLANEOUS) ×3 IMPLANT

## 2016-08-24 NOTE — Progress Notes (Signed)
ANTEPARTUM NOTE - Hospital Day 2 - interval note  Subjective: Patient is s/p open cholecystectomy and reports "I'm in a lot of pain" - she reports the Morphine is not working  Radiographer, therapeuticTolerating small sips  Foley catheter in place  Objective: Vital signs: VS: Blood pressure (!) 110/50, pulse 91, temperature 98.1 F (36.7 C), temperature source Oral, resp. rate 18, weight 60.8 kg (134 lb), last menstrual period 03/24/2016, SpO2 100 %, unknown if currently breastfeeding.  Physical exam:        General appearance/behavior: grimacing        Abdomen: Dressing with small shadowing/ intact       Extremities: SCDs in place  Fetal Assessment @1800 : FHR 130 bpm  Assessment: 19+[redacted] weeks gestation S/P Open Cholecystectomy    Plan:  1. Ancef 2 grams every 6 hours x 24 hours 2. FHTs every 4 hours Consulted surgery for pain management recommendations: 3. Begin Tylenol 1 gram IV every 6 hours x 24 hours 4. Morphine 4mg  every 2 hours PRN for pain  Pt. Did not have any questions.   Dr. Elesa MassedWard updated with patient status / and orders received   Carlean JewsMeredith Sigmon, CNM

## 2016-08-24 NOTE — Progress Notes (Signed)
Present in the PACU when fetal heart tones proved to be present and normal.  Once patient was awake I spoke to her concerning the complications that occurred in the operating room. I described in detail. I also then asked her permission to speak to her fiance and mother and proceeded to the third floor to discuss in person the complication.  I described for both patient and family the injury that occurred due to trocar placement under direct vision and the intraoperative consultation by Drs. Beasley in [place] which resulted in some single suture closure of the 5 mm uterine rent. Fetal heart tones were present and normal. Dr. Elesa MassedWard is planning to order an ultrasound of the uterus and fetus when she believes it is appropriate. The cholecystectomy that was performed via an open technique was described and the expected postoperative course was described as well.  Nursing staff was present during both discussions.

## 2016-08-24 NOTE — Anesthesia Postprocedure Evaluation (Signed)
Anesthesia Post Note  Patient: Jessica Coffey  Procedure(s) Performed: Procedure(s) (LRB): LAPAROSCOPIC CHOLECYSTECTOMY CONVERTED TO OPEN (N/A)  Patient location during evaluation: PACU Anesthesia Type: General Level of consciousness: awake and alert Pain management: pain level controlled Vital Signs Assessment: post-procedure vital signs reviewed and stable Respiratory status: spontaneous breathing, nonlabored ventilation, respiratory function stable and patient connected to nasal cannula oxygen Cardiovascular status: blood pressure returned to baseline and stable Postop Assessment: no signs of nausea or vomiting Anesthetic complications: no    Last Vitals:  Vitals:   08/24/16 1832 08/24/16 1847  BP: 124/69 122/66  Pulse: 91 94  Resp: 10 13  Temp:  36.3 C    Last Pain:  Vitals:   08/24/16 1847  TempSrc:   PainSc: Asleep                 [name] S

## 2016-08-24 NOTE — Progress Notes (Signed)
[facility], checked fetal heart tones. States rate 130

## 2016-08-24 NOTE — Anesthesia Preprocedure Evaluation (Signed)
Anesthesia Evaluation  Patient identified by MRN, date of birth, ID band Patient awake    Reviewed: Allergy & Precautions, NPO status , Patient's Chart, lab work & pertinent test results  History of Anesthesia Complications Negative for: history of anesthetic complications  Airway Mallampati: II       Dental   Pulmonary neg pulmonary ROS,           Cardiovascular negative cardio ROS       Neuro/Psych negative neurological ROS     GI/Hepatic negative GI ROS, Neg liver ROS,   Endo/Other  negative endocrine ROS  Renal/GU negative Renal ROS     Musculoskeletal   Abdominal   Peds  Hematology  (+) Blood dyscrasia (sickle cell trait), anemia ,   Anesthesia Other Findings   Reproductive/Obstetrics (+) Pregnancy (20 wks, prr-op FHT 154)                             Anesthesia Physical Anesthesia Plan  ASA: II and emergent  Anesthesia Plan: General   Post-op Pain Management:    Induction: Intravenous  Airway Management Planned: Oral ETT  Additional Equipment:   Intra-op Plan:   Post-operative Plan:   Informed Consent: I have reviewed the patients History and Physical, chart, labs and discussed the procedure including the risks, benefits and alternatives for the proposed anesthesia with the patient or authorized representative who has indicated his/her understanding and acceptance.     Plan Discussed with:   Anesthesia Plan Comments:         Anesthesia Quick Evaluation

## 2016-08-24 NOTE — Op Note (Signed)
Pre-operative Diagnosis: Biliary colic with intractable nausea and vomiting with pregnancy  Post-operative Diagnosis: Same  Procedure: Diagnostic laparoscopy open cholecystectomy repair of 5 mm uterine wall injury  Surgeon: Adah Salvageichard E. Excell Seltzerooper, MD FACS  Anesthesia: Gen. with endotracheal tube  Assistant Drs. Dalbert GarnetBeasley and [place], [place]  Procedure Details  The patient was seen again in the Holding Room. The benefits, complications, treatment options, and expected outcomes were discussed with the patient. The risks of bleeding, infection, recurrence of symptoms, failure to resolve symptoms, bile duct damage, bile duct leak, retained common bile duct stone, bowel injury, any of which could require further surgery and/or ERCP, stent, or papillotomy were reviewed with the patient. We had also discussed the risks to the patient's unborn fetus and that the second trimester was the safest time to proceed with surgery but we discussed general anesthetic risks as well as mechanical risks. The likelihood of improving the patient's symptoms with return to their baseline status is good.  The patient  concurred with the proposed plan, giving informed consent.  The patient was taken to Operating Room, identified as Jessica Kaufmannomika [place] and the procedure verified as Laparoscopic Cholecystectomy.  A Time Out was held and the above information confirmed.  Prior to the induction of general anesthesia, antibiotic prophylaxis was administered. VTE prophylaxis was in place. General endotracheal anesthesia was then administered and tolerated well. After the induction, the abdomen was prepped with Chloraprep and draped in the sterile fashion. The patient was positioned in the supine position.  The uterus and previously been palpated at the umbilicus or slightly below therefore an incision was made above the umbilicus after placing local anesthetic in that area. Utilizing the Visiport and a 5 mm camera progression through subcutaneous  tissues and fascial layers was visualized angling towards the cephalad area. The camera was removed and replaced through the sheath and fluid was noted and it was immediately recognized that the camera had been placed into the uterus there was no sign of bleeding. No  insufflation had occurred. It was immediately recognized and pages went out to Drs. Beasley and [place] who had been nearby prior to the start of surgery. (This was Dr. Francena HanlyBeasley's patient and she knew her well). Both obstetricians responded promptly and scrubbed in after telephoning [facility] maternal-fetal medicine for advice concerning management of this 5 mm injury. With Dr. Francena HanlyBeasley's assistance a midline incision was utilized to open and explore the abdominal cavity. It was noted that the trocar sheath was in the anterior surface of the uterus near the fundus. The uterus was found to be well above the umbilicus at the time of the surgery.  With Drs. Beasley and [place] present the trocar was removed and approximately 20 cc of amniotic fluid was lost while Dr. Elesa MassedWard placed a figure-of-eight 2-0 Vicryl on the uterus to adequately close the 5 mm rent. This was inspected and there was no sign of bleeding or leakage.  The incision on the midline was enlarged to allow for visualization of an intrahepatic gallbladder. Open cholecystectomy was performed in the following fashion. Utilizing electrocautery sparingly from the fundus towards the infundibulum the gallbladder was removed from the gallbladder fossa branches of the cystic artery were doubly clipped and divided the cystic duct was well identified at the infundibulum and doubly clipped and divided the specimen was sent off for examination after inspection. The area was irrigated with copious amounts normal saline. There was no sign of bleeding or bile leak. The clips appeared to be in good  position.  The uterine closure was reinspected and found to be adequate. A Foley catheter had been placed  intraoperatively and this allowed the uterus to return to a more age appropriate position but was still above the umbilicus.  At this point the sponge lap needle count was correct there was no sign of bile leak or bleeding and the uterine repair was intact and appeared adequate.  The wound was then closed with running #1 PDS followed by 40 subcutaneous Monocryl. Placement of Mastisol and Steri-Strips and a honeycomb dressing was performed. Patient tolerated the procedure well she was taken to the recovery room in stable condition to be admitted for continued care. Dr. Elesa Massed is on call and suggested that she would order an ultrasound at the appropriate time.    Findings: Chronic Cholecystitis   Estimated Blood Loss: Minimal         Drains: None         Specimens: Gallbladder           Complications: 5 mm trocar rent in the anterior fundus of the uterus which was repaired with Vicryl suture. Drs. Dalbert Garnet and [place] were in attendance for repair and intraoperative consultation. Telephone consultation had been performed to [facility] maternal-fetal medicine by Dr. Dalbert Garnet. An ultrasound will be ordered by Dr. Elesa Massed at the appropriate time.  This will be discussed with the patient once she is awake in the recovery room.               Richard E. Excell Seltzer, MD, FACS

## 2016-08-24 NOTE — Anesthesia Procedure Notes (Signed)
Procedure Name: Intubation Date/Time: 08/24/2016 4:11 PM Performed by: Michaele OfferSAVAGE, Murriel Holwerda Pre-anesthesia Checklist: Patient identified, Emergency Drugs available, Suction available, Patient being monitored and Timeout performed Patient Re-evaluated:Patient Re-evaluated prior to inductionOxygen Delivery Method: Circle system utilized Preoxygenation: Pre-oxygenation with 100% oxygen Intubation Type: IV induction, Rapid sequence and Cricoid Pressure applied Laryngoscope Size: Mac and 3 Grade View: Grade I Tube type: Oral Tube size: 7.0 mm Number of attempts: 1 Airway Equipment and Method: Rigid stylet Placement Confirmation: ETT inserted through vocal cords under direct vision,  positive ETCO2 and breath sounds checked- equal and bilateral Secured at: 21 cm Tube secured with: Tape Dental Injury: Teeth and Oropharynx as per pre-operative assessment

## 2016-08-24 NOTE — Transfer of Care (Signed)
Immediate Anesthesia Transfer of Care Note  Patient: Jessica Coffey  Procedure(s) Performed: Procedure(s): LAPAROSCOPIC CHOLECYSTECTOMY (N/A)  Patient Location: PACU  Anesthesia Type:General  Level of Consciousness: awake, alert , oriented and patient cooperative  Airway & Oxygen Therapy: Patient Spontanous Breathing and Patient connected to face mask oxygen  Post-op Assessment: Report given to RN, Post -op Vital signs reviewed and stable and Patient moving all extremities X 4  Post vital signs: Reviewed and stable  Last Vitals:  Vitals:   08/24/16 1445 08/24/16 1747  BP:  (!) 129/50  Pulse: 78 (!) 110  Resp: 18 16  Temp: 36.8 C 36.1 C    Last Pain:  Vitals:   08/24/16 1423  TempSrc:   PainSc: 10-Worst pain ever         Complications: No apparent anesthesia complications

## 2016-08-24 NOTE — Progress Notes (Signed)
Patient's sleeping comfortably in recovery room. A phone call was made to the patient's room where her fiance and mother were present I gave him information concerning her status but did not speak directly to the complication of a uterine trocar injury until I have informed the patient and received permission to speak to them.

## 2016-08-24 NOTE — Progress Notes (Signed)
S: My pain is a 7 on 1-10 scale. I am hurting in the Rt upper area. I cannot keep any liqs down. They make me vomit. Vitals:   08/23/16 1942 08/23/16 2328  BP: (!) 91/49 (!) 87/43  Pulse: 69 70  Resp: 18 18  Temp: 98.6 F (37 C) 98.8 F (37.1 C)  O: 25 you black female in NAD.  Heart: S1S2, RRR, No M/R/G. Lungs: CTA bilat, no W/R/R. Abd: TTP over the RUQ, Gravid Voiding ok A: IUP at 20 weeks with cholelithiasis P: Surgery to see pt this am. NPO for now due to N&V. Tx pain and nausea.

## 2016-08-24 NOTE — Progress Notes (Signed)
CC: Right upper quadrant pain Subjective: This a patient with continuous now right upper quadrant pain with nausea and multiple emesis she feels better now but she is just been medicated with narcotics and is requiring narcotics frequently. She denies fevers or chills. No problems with her IUP  Objective: Vital signs in last 24 hours: Temp:  [98 F (36.7 C)-98.8 F (37.1 C)] 98.6 F (37 C) (09/15 0735) Pulse Rate:  [66-71] 71 (09/15 0735) Resp:  [18] 18 (09/15 0735) BP: (87-98)/(43-54) 98/54 (09/15 0735) SpO2:  [100 %] 100 % (09/15 0735)    Intake/Output from previous day: 09/14 0701 - 09/15 0700 In: 2995 [P.O.:240; I.V.:2755] Out: 500 [Urine:350; Emesis/NG output:150] Intake/Output this shift: No intake/output data recorded.  Physical exam:  Vital signs are reviewed.  No icterus no jaundice.  Abdomen is gravid soft tender in the right upper quadrant with a questionable Murphy sign abs are nontender  Lab Results: CBC   Recent Labs  08/22/16 2142  WBC 5.8  HGB 9.5*  HCT 26.8*  PLT 186   BMET  Recent Labs  08/22/16 2142  NA 137  K 3.8  CL 106  CO2 25  GLUCOSE 85  BUN 7  CREATININE 0.49  CALCIUM 9.1   PT/INR No results for input(s): LABPROT, INR in the last 72 hours. ABG No results for input(s): PHART, HCO3 in the last 72 hours.  Invalid input(s): PCO2, PO2  Studies/Results: US Ob Comp + 14 Wk  Result Date: 08/23/2016 CLINICAL DATA:  Abdominal pain. EXAM: OBSTETRICAL ULTRASOUND >14 WKS FINDINGS: Number of Fetuses: 1 Heart Rate:  144 bpm Movement: Yes Presentation: Cephalic Previa: No Placental Location: Posterior Amniotic Fluid (Subjective): Normal Amniotic Fluid (Objective): Vertical pocket 3.6cm FETAL BIOMETRY BPD:  4.41cm 19w 2d HC:    16.47cm  19w   1d AC:   14.46cm  19w   5d FL:   3.18cm  19w   6d Current Mean GA: 19w 1d FETAL ANATOMY Lateral Ventricles: Appears normal Thalami/CSP: Appears normal Posterior Fossa:  Appears normal Nuchal Region:  Appears normal    NT= 3mm Upper Lip: Appears normal Spine: Appears normal 4 Chamber Heart on Left: Appears normal LVOT: Appears normal RVOT: Appears normal Stomach on Left: Appears normal 3 Vessel Cord: Appears normal Cord Insertion site: Appears normal Kidneys: Appears normal Bladder: Appears normal Extremities: Appears normal Sex: Female Maternal Findings: Cervix:  Closed.  3.9 cm in length. IMPRESSION: 1. Single live intrauterine pregnancy as detailed above. Electronically Signed   By: Elige Ko   On: 08/23/2016 15:18   US Abdomen Limited Ruq  Result Date: 08/23/2016 CLINICAL DATA:  Nausea and vomiting.  Right upper quadrant pain. EXAM: US ABDOMEN LIMITED - RIGHT UPPER QUADRANT COMPARISON:  None. FINDINGS: Gallbladder: Multiple mobile gallstones measuring up to 6 mm. The patient was not tender to probe pressure over the gallbladder. No gallbladder mural thickening. No pericholecystic fluid. Common bile duct: Diameter: 2.3 mm Liver: No focal lesion identified. Within normal limits in parenchymal echogenicity. Mild right renal hydronephrosis, typical for second trimester. IMPRESSION: Cholelithiasis without sonographic evidence of cholecystitis. Electronically Signed   By: Ellery Plunk M.D.   On: 08/23/2016 01:35    Anti-infectives: Anti-infectives    None      Assessment/Plan:  This a patient was symptomatically lithiasis biliary colic in a pregnant state. She is 19 or [redacted] weeks pregnant. Clearly in the second trimester this would be the safest time to operate and the likelihood that she would be able to  make it full-term without surgical intervention is very low. With that in mind M offering laparoscopic cholecystectomy for control of her symptoms to improve nutritional status for her unborn child. I discussed with her the rationale for offering this procedure and the options of observation and medical management of her pain and nausea. She is opted for surgery. I discussed with her the risks  of bleeding and infection damage to her unborn fetus from either anesthetic or medication complications or mechanical complication secondary to laparoscopy. I also discussed with her the risk of conversion to an open procedure bowel injury or bile duct injury and failure to resolve all her symptoms she understood and agreed to proceed I discussed this patient with nursing.  Lattie Hawichard E Sherena Machorro, MD, FACS  08/24/2016

## 2016-08-25 DIAGNOSIS — O0932 Supervision of pregnancy with insufficient antenatal care, second trimester: Secondary | ICD-10-CM | POA: Diagnosis present

## 2016-08-25 DIAGNOSIS — E8809 Other disorders of plasma-protein metabolism, not elsewhere classified: Secondary | ICD-10-CM | POA: Diagnosis present

## 2016-08-25 DIAGNOSIS — D573 Sickle-cell trait: Secondary | ICD-10-CM | POA: Diagnosis present

## 2016-08-25 DIAGNOSIS — K8020 Calculus of gallbladder without cholecystitis without obstruction: Secondary | ICD-10-CM | POA: Diagnosis present

## 2016-08-25 DIAGNOSIS — O99619 Diseases of the digestive system complicating pregnancy, unspecified trimester: Secondary | ICD-10-CM

## 2016-08-25 DIAGNOSIS — Z3A21 21 weeks gestation of pregnancy: Secondary | ICD-10-CM | POA: Diagnosis not present

## 2016-08-25 DIAGNOSIS — K8010 Calculus of gallbladder with chronic cholecystitis without obstruction: Secondary | ICD-10-CM | POA: Diagnosis present

## 2016-08-25 DIAGNOSIS — Y658 Other specified misadventures during surgical and medical care: Secondary | ICD-10-CM | POA: Diagnosis present

## 2016-08-25 DIAGNOSIS — Y92234 Operating room of hospital as the place of occurrence of the external cause: Secondary | ICD-10-CM | POA: Diagnosis present

## 2016-08-25 DIAGNOSIS — Q441 Other congenital malformations of gallbladder: Secondary | ICD-10-CM | POA: Diagnosis not present

## 2016-08-25 DIAGNOSIS — N9972 Accidental puncture and laceration of a genitourinary system organ or structure during other procedure: Secondary | ICD-10-CM | POA: Diagnosis present

## 2016-08-25 DIAGNOSIS — Z87440 Personal history of urinary (tract) infections: Secondary | ICD-10-CM | POA: Diagnosis not present

## 2016-08-25 DIAGNOSIS — E876 Hypokalemia: Secondary | ICD-10-CM | POA: Diagnosis present

## 2016-08-25 DIAGNOSIS — O99282 Endocrine, nutritional and metabolic diseases complicating pregnancy, second trimester: Secondary | ICD-10-CM | POA: Diagnosis present

## 2016-08-25 DIAGNOSIS — O99012 Anemia complicating pregnancy, second trimester: Secondary | ICD-10-CM | POA: Diagnosis present

## 2016-08-25 DIAGNOSIS — O26612 Liver and biliary tract disorders in pregnancy, second trimester: Secondary | ICD-10-CM | POA: Diagnosis present

## 2016-08-25 DIAGNOSIS — R748 Abnormal levels of other serum enzymes: Secondary | ICD-10-CM | POA: Diagnosis present

## 2016-08-25 DIAGNOSIS — R739 Hyperglycemia, unspecified: Secondary | ICD-10-CM | POA: Diagnosis present

## 2016-08-25 LAB — COMPREHENSIVE METABOLIC PANEL
ALBUMIN: 2.8 g/dL — AB (ref 3.5–5.0)
ALT: 16 U/L (ref 14–54)
AST: 35 U/L (ref 15–41)
Alkaline Phosphatase: 37 U/L — ABNORMAL LOW (ref 38–126)
Anion gap: 5 (ref 5–15)
CO2: 26 mmol/L (ref 22–32)
Calcium: 8.3 mg/dL — ABNORMAL LOW (ref 8.9–10.3)
Chloride: 106 mmol/L (ref 101–111)
Creatinine, Ser: 0.51 mg/dL (ref 0.44–1.00)
GFR calc Af Amer: >60 mL/min (ref >60)
GFR calc non Af Amer: >60 mL/min (ref >60)
GLUCOSE: 116 mg/dL — AB (ref 65–99)
POTASSIUM: 3.6 mmol/L (ref 3.5–5.1)
Sodium: 137 mmol/L (ref 135–145)
TOTAL PROTEIN: 5.8 g/dL — AB (ref 6.5–8.1)

## 2016-08-25 LAB — CBC WITH DIFFERENTIAL/PLATELET
BASOS ABS: 0 10*3/uL (ref 0–0.1)
BASOS PCT: 0 %
Eosinophils Absolute: 0.1 10*3/uL (ref 0–0.7)
Eosinophils Relative: 1 %
HEMATOCRIT: 26.8 % — AB (ref 35.0–47.0)
HEMOGLOBIN: 9.5 g/dL — AB (ref 12.0–16.0)
Lymphocytes Relative: 13 %
Lymphs Abs: 0.9 10*3/uL — ABNORMAL LOW (ref 1.0–3.6)
MCH: 30.3 pg (ref 26.0–34.0)
MCHC: 35.6 g/dL (ref 32.0–36.0)
MCV: 85.1 fL (ref 80.0–100.0)
MONO ABS: 0.3 10*3/uL (ref 0.2–0.9)
Monocytes Relative: 4 %
NEUTROS ABS: 5.4 10*3/uL (ref 1.4–6.5)
NEUTROS PCT: 82 %
Platelets: 158 10*3/uL (ref 150–440)
RBC: 3.15 MIL/uL — AB (ref 3.80–5.20)
RDW: 13.3 % (ref 11.5–14.5)
WBC: 6.6 10*3/uL (ref 3.6–11.0)

## 2016-08-25 MED ORDER — INDOMETHACIN 50 MG PO CAPS
50.0000 mg | ORAL_CAPSULE | Freq: Four times a day (QID) | ORAL | Status: AC
Start: 2016-08-25 — End: 2016-08-26
  Administered 2016-08-26 (×3): 50 mg via ORAL
  Filled 2016-08-25 (×4): qty 1

## 2016-08-25 MED ORDER — LIDOCAINE 5 % EX PTCH
1.0000 | MEDICATED_PATCH | CUTANEOUS | Status: DC
  Administered 2016-08-25 – 2016-08-28 (×4): 1 via TRANSDERMAL
  Filled 2016-08-25 (×4): qty 1

## 2016-08-25 MED ORDER — HYDROMORPHONE HCL 1 MG/ML IJ SOLN
0.5000 mg | INTRAMUSCULAR | Status: DC | PRN
  Administered 2016-08-25 – 2016-08-28 (×13): 0.5 mg via INTRAVENOUS
  Filled 2016-08-25 (×15): qty 1

## 2016-08-25 MED ORDER — HYDROMORPHONE HCL 1 MG/ML IJ SOLN: 0.5000 mg | Freq: Once | INTRAMUSCULAR | Status: DC

## 2016-08-25 MED ORDER — LIDOCAINE 5 % EX PTCH: 1.0000 | MEDICATED_PATCH | CUTANEOUS | Status: DC

## 2016-08-25 MED ORDER — INDOMETHACIN 50 MG PO CAPS
100.0000 mg | ORAL_CAPSULE | Freq: Once | ORAL | Status: AC
Start: 2016-08-25 — End: 2016-08-25
  Administered 2016-08-25: 100 mg via ORAL
  Filled 2016-08-25: qty 2

## 2016-08-25 MED ORDER — HYDROMORPHONE HCL 1 MG/ML IJ SOLN
0.5000 mg | Freq: Once | INTRAMUSCULAR | Status: AC
  Administered 2016-08-25: 0.5 mg via INTRAVENOUS
  Filled 2016-08-25: qty 1

## 2016-08-25 NOTE — Plan of Care (Signed)
Problem: Pain Managment: Goal: General experience of comfort will improve Outcome: Progressing Some progression with achieving tolerable pain control   Problem: Activity: Goal: Risk for activity intolerance will decrease Outcome: Progressing Tolerating ambulation to BR and chair this shift.  Problem: Fluid Volume: Goal: Ability to maintain a balanced intake and output will improve Outcome: Progressing Tolerating diet  Problem: Nutrition: Goal: Adequate nutrition will be maintained Outcome: Progressing Tolerating diet  Problem: Bowel/Gastric: Goal: Will not experience complications related to bowel motility Outcome: Progressing NO BM at this time, passing flatus

## 2016-08-25 NOTE — Progress Notes (Signed)
1 Day Post-Op  Subjective: Patient is having considerable discomfort this morning and the morphine does not seem to be working I have ordered a half a milligram of dye allotted and will assess.  Patient describes incisional pain she has no further right upper quadrant pain and feels well other than the incisional pain she's had her Foley catheter removed and has been able to urinate. No nausea or vomiting and is tolerating clear liquids.  Objective: Vital signs in last 24 hours: Temp:  [97 F (36.1 C)-98.9 F (37.2 C)] 98.2 F (36.8 C) (09/16 0728) Pulse Rate:  [66-114] 76 (09/16 0728) Resp:  [10-19] 18 (09/16 0728) BP: (97-129)/(48-70) 108/63 (09/16 0728) SpO2:  [98 %-100 %] 100 % (09/16 0728) Weight:  [134 lb (60.8 kg)] 134 lb (60.8 kg) (09/15 1445)    Intake/Output from previous day: 09/15 0701 - 09/16 0700 In: 3984 [I.V.:3584; IV Piggyback:400] Out: 2585 [Urine:2575; Blood:10] Intake/Output this shift: Total I/O In: 358.3 [I.V.:258.3; IV Piggyback:100] Out: 375 [Urine:375]  Physical exam:  Vital signs are reviewed and stable fetal heart tones are normal. This is reported by nursing. Abdomen is soft tender around the incision only incision is dressed with a honeycomb dressing. No icterus no jaundice  Lab Results: CBC   Recent Labs  08/22/16 2142 08/25/16 0603  WBC 5.8 6.6  HGB 9.5* 9.5*  HCT 26.8* 26.8*  PLT 186 158   BMET  Recent Labs  08/22/16 2142 08/25/16 0603  NA 137 137  K 3.8 3.6  CL 106 106  CO2 25 26  GLUCOSE 85 116*  BUN 7 <5*  CREATININE 0.49 0.51  CALCIUM 9.1 8.3*   PT/INR No results for input(s): LABPROT, INR in the last 72 hours. ABG No results for input(s): PHART, HCO3 in the last 72 hours.  Invalid input(s): PCO2, PO2  Studies/Results: US Ob Comp + 14 Wk  Result Date: 08/23/2016 CLINICAL DATA:  Abdominal pain. EXAM: OBSTETRICAL ULTRASOUND >14 WKS FINDINGS: Number of Fetuses: 1 Heart Rate:  144 bpm Movement: Yes Presentation:  Cephalic Previa: No Placental Location: Posterior Amniotic Fluid (Subjective): Normal Amniotic Fluid (Objective): Vertical pocket 3.6cm FETAL BIOMETRY BPD:  4.41cm 19w 2d HC:    16.47cm  19w   1d AC:   14.46cm  19w   5d FL:   3.18cm  19w   6d Current Mean GA: 19w 1d FETAL ANATOMY Lateral Ventricles: Appears normal Thalami/CSP: Appears normal Posterior Fossa:  Appears normal Nuchal Region: Appears normal    NT= 3mm Upper Lip: Appears normal Spine: Appears normal 4 Chamber Heart on Left: Appears normal LVOT: Appears normal RVOT: Appears normal Stomach on Left: Appears normal 3 Vessel Cord: Appears normal Cord Insertion site: Appears normal Kidneys: Appears normal Bladder: Appears normal Extremities: Appears normal Sex: Female Maternal Findings: Cervix:  Closed.  3.9 cm in length. IMPRESSION: 1. Single live intrauterine pregnancy as detailed above. Electronically Signed   By: Elige Ko   On: 08/23/2016 15:18    Anti-infectives: Anti-infectives    Start     Dose/Rate Route Frequency Ordered Stop   08/24/16 1930  ceFAZolin (ANCEF) IVPB 2g/100 mL premix     2 g 200 mL/hr over 30 Minutes Intravenous Every 6 hours 08/24/16 1919 08/25/16 1759   08/24/16 1039  ceFAZolin (ANCEF) IVPB 2g/100 mL premix     2 g 200 mL/hr over 30 Minutes Intravenous 30 min pre-op 08/24/16 1039 08/24/16 1628      Assessment/Plan: s/p Procedure(s): LAPAROSCOPIC CHOLECYSTECTOMY CONVERTED TO OPEN  Labs reviewed with normal liver function tests and unchanged hemoglobin. Patient is doing very well at this point I discussed with Dr. Dalbert GarnetBeasley the patient's condition and I will adjust her pain medications appropriately and advance her diet as needed today. We will saline lock the patient's IV. Dr. Dalbert GarnetBeasley is considering an OB ultrasound for later today. I discussed again the occurrences in the operating room with the patient as she states that she did not remember my discussion from PACU yesterday. No family was present this  morning  Lattie Hawichard E Tennie Grussing, MD, FACS  08/25/2016

## 2016-08-25 NOTE — Progress Notes (Addendum)
ANTEPARTUM NOTE - Hospital Day 3 - interval note  Subjective: Patient is s/p open cholecystectomy and reports she is better tolerant of her pain with IV tylenol and morphine.  Her pain is located in the epigastric/supraumbilical area, concordant to her incision.  Tolerating small sips  Foley catheter in place  Objective: Vital signs: VS: Blood pressure 105/50, pulse 88, temperature 98.7 F (37.1 C), temperature source Oral, resp. rate 16, SpO2 98 %  Physical exam:        General appearance/behavior: comfortable        Abdomen: Dressing with small shadowing/ intact       Extremities: SCDs in place  Fetal Assessment @ 0300: FHR 144 bpm  Assessment: 19+[redacted] weeks gestation S/P Open Cholecystectomy with uterine wall interruption (trochar)   Plan:  1. Ancef 2 grams every 6 hours x 24 hours 2. FHTs every 4 hours 3. Continue Tylenol and Morphine  -- adding lidocaine patch to incision area 4. Ultrasound warranted -- need MFM to do this as general ultrasound tech/radiologist would not have level of detail needed to assess membrane status.  As the membranes were pierced through with the trochar, she is at risk for PPROM and membrane separation.  She will need weekly assessments of the membranes until term.  This was discussed with patient in detail.  Patient did not have any questions.   ----- Ranae Plumberhelsea Kyland No, MD Attending Obstetrician and Gynecologist [facility], Department of OB/GYN [facility]

## 2016-08-25 NOTE — Progress Notes (Signed)
Patient is alert and oriented. Reporting abdominal surgical pain improved with IV medication. Tolerating diet with no n/v at this time. Up to BR to void x 2. Incision WNL. Fetal heart sounds remain WNL, assessed by charge RN. Resting quietly at this time, will continue to monitor.

## 2016-08-25 NOTE — Progress Notes (Signed)
Dr. Elesa MassedWard notified via phone of pt complaint of increased pain at surgical site, and new onset pain in lower abdomen.  Order received for Indomethicin.  MD states fetal monitoring is not needed at this time. Reynold BowenSusan Paisley Nashira Mcglynn, RN 08/25/2016 8:40 PM

## 2016-08-26 DIAGNOSIS — S3760XA Unspecified injury of uterus, initial encounter: Secondary | ICD-10-CM | POA: Diagnosis not present

## 2016-08-26 LAB — CBC WITH DIFFERENTIAL/PLATELET
Basophils Absolute: 0 10*3/uL (ref 0–0.1)
Basophils Relative: 0 %
Eosinophils Absolute: 0.1 10*3/uL (ref 0–0.7)
Eosinophils Relative: 1 %
HEMATOCRIT: 25.6 % — AB (ref 35.0–47.0)
HEMOGLOBIN: 9.1 g/dL — AB (ref 12.0–16.0)
LYMPHS ABS: 0.7 10*3/uL — AB (ref 1.0–3.6)
LYMPHS PCT: 10 %
MCH: 30.6 pg (ref 26.0–34.0)
MCHC: 35.8 g/dL (ref 32.0–36.0)
MCV: 85.6 fL (ref 80.0–100.0)
MONOS PCT: 3 %
Monocytes Absolute: 0.2 10*3/uL (ref 0.2–0.9)
NEUTROS ABS: 6.1 10*3/uL (ref 1.4–6.5)
Neutrophils Relative %: 86 %
Platelets: 162 10*3/uL (ref 150–440)
RBC: 2.99 MIL/uL — AB (ref 3.80–5.20)
RDW: 13.8 % (ref 11.5–14.5)
WBC: 7 10*3/uL (ref 3.6–11.0)

## 2016-08-26 LAB — COMPREHENSIVE METABOLIC PANEL
ALK PHOS: 39 U/L (ref 38–126)
ALT: 18 U/L (ref 14–54)
ANION GAP: 7 (ref 5–15)
AST: 33 U/L (ref 15–41)
Albumin: 2.8 g/dL — ABNORMAL LOW (ref 3.5–5.0)
BUN: <5 mg/dL — ABNORMAL LOW (ref 6–20)
CALCIUM: 8.6 mg/dL — AB (ref 8.9–10.3)
CO2: 25 mmol/L (ref 22–32)
Chloride: 105 mmol/L (ref 101–111)
Creatinine, Ser: 0.52 mg/dL (ref 0.44–1.00)
GFR calc non Af Amer: >60 mL/min (ref >60)
Glucose, Bld: 107 mg/dL — ABNORMAL HIGH (ref 65–99)
Potassium: 3.1 mmol/L — ABNORMAL LOW (ref 3.5–5.1)
SODIUM: 137 mmol/L (ref 135–145)
TOTAL PROTEIN: 5.9 g/dL — AB (ref 6.5–8.1)
Total Bilirubin: 0.7 mg/dL (ref 0.3–1.2)

## 2016-08-26 MED ORDER — BISACODYL 10 MG RE SUPP
10.0000 mg | Freq: Once | RECTAL | Status: AC
  Administered 2016-08-26: 10 mg via RECTAL
  Filled 2016-08-26: qty 1

## 2016-08-26 NOTE — Progress Notes (Signed)
Dr. Excell Seltzerooper notified of pt complaint of nausea and emesis, and pt has not passed gas yet.  Order received for Dulcolax Suppository. Reynold BowenSusan Paisley Bee Hammerschmidt, RN 08/26/2016 7:14 PM

## 2016-08-26 NOTE — Progress Notes (Signed)
Visited with the patient and family. Patient is tolerating a regular diet and having minimal incisional pain. I reminded her and ask for her to allotted periodically should she need it. She asked about when to go home. And I suggested that some of that decision making would be made based on the ultrasound tomorrow and in discussion with Dr. Elesa MassedWard.  Informed patient to Dr. Michela PitcherEly would be covering for me tomorrow. Questions answered for them.

## 2016-08-26 NOTE — Progress Notes (Signed)
ANTEPARTUM NOTE - Hospital Day 4 - interval note  Subjective: Patient is s/p open cholecystectomy and reports she is better tolerant of her pain today, she states overall it has improved.  Indocin was added yesterday for some low-abdominal pain, and since this addition she has not had that pain again, and her incisional pain has improved as well.  She continues to tolerate a full liquid diet.  Had some nausea this morning and she thinks it is due to her medication.  Ambulating to bathroom, voiding spontaneously.  Denies CP, SOB, F/C/D/C/V, leg pain  Objective: Vital signs: BP 114/65 (BP Location: Left Arm)   Pulse 82   Temp 98.5 F (36.9 C) (Oral)   Resp 18  SpO2 100%   Physical exam:        General appearance/behavior: comfortable        Abdomen: Dressing with old blood in spots, intact, lidocain patches on either side, non-tender bilateral pelvis       Fundus: non tender       Extremities: SCDs in place  Fetal Assessment @ 0545: FHR 151 bpm  Assessment: 19+[redacted] weeks gestation S/P Open Cholecystectomy with uterine wall interruption (trochar)   Plan:  1. S/p Ancef 2 grams every 6 hours x 24 hours 2. FHTs every 4 hours -- have been WNL 3. Continue pain relief measures, transition to all PO meds.  OK to continue an NSAID at this gestational age.   4. Ultrasound warranted -- need MFM to do this as general ultrasound tech/radiologist would not have level of detail needed to assess membrane status.  As the membranes were pierced through with the trochar, she is at risk for PPROM and membrane separation.  She will need weekly assessments of the membranes until term.  This was again discussed with patient in detail.  Will schedule for tomorrow. 5. Dispo: remain inpatient status until discharge appropriate from gen surg   Patient did not have any questions.   ----- Ranae Plumberhelsea Macrina Lehnert, MD Attending Obstetrician and Gynecologist [facility], Department of OB/GYN [facility]

## 2016-08-26 NOTE — Progress Notes (Signed)
2 Days Post-Op  Subjective: Patient is tolerating full liquid diet at this point she's had no further nausea and no right upper quadrant pain that she was having before surgery she complains of incisional pain which is less but now she's had some contractions this morning that she states that her better since she's had the Indocin. Dr. Jolyn NapWard's note is reviewed.  Objective: Vital signs in last 24 hours: Temp:  [98 F (36.7 C)-98.6 F (37 C)] 98 F (36.7 C) (09/17 0853) Pulse Rate:  [70-92] 80 (09/17 0853) Resp:  [12-20] 20 (09/17 0853) BP: (110-118)/(59-69) 110/66 (09/17 0853) SpO2:  [100 %] 100 % (09/17 0853)    Intake/Output from previous day: 09/16 0701 - 09/17 0700 In: 1178.3 [P.O.:480; I.V.:298.3; IV Piggyback:400] Out: 2025 [Urine:2025] Intake/Output this shift: No intake/output data recorded.  Physical exam: Vital signs are stable afebrile, fetal heart rate noted Abdomen is soft tenderness around the incision wound is clean no erythema or drainage  Lab Results: CBC   Recent Labs  08/25/16 0603 08/26/16 0523  WBC 6.6 7.0  HGB 9.5* 9.1*  HCT 26.8* 25.6*  PLT 158 162   BMET  Recent Labs  08/25/16 0603 08/26/16 0523  NA 137 137  K 3.6 3.1*  CL 106 105  CO2 26 25  GLUCOSE 116* 107*  BUN <5* <5*  CREATININE 0.51 0.52  CALCIUM 8.3* 8.6*   PT/INR No results for input(s): LABPROT, INR in the last 72 hours. ABG No results for input(s): PHART, HCO3 in the last 72 hours.  Invalid input(s): PCO2, PO2  Studies/Results: No results found.  Anti-infectives: Anti-infectives    Start     Dose/Rate Route Frequency Ordered Stop   08/24/16 1930  ceFAZolin (ANCEF) IVPB 2g/100 mL premix     2 g 200 mL/hr over 30 Minutes Intravenous Every 6 hours 08/24/16 1919 08/25/16 1436   08/24/16 1039  ceFAZolin (ANCEF) IVPB 2g/100 mL premix     2 g 200 mL/hr over 30 Minutes Intravenous 30 min pre-op 08/24/16 1039 08/24/16 1628      Assessment/Plan: s/p  Procedure(s): LAPAROSCOPIC CHOLECYSTECTOMY CONVERTED TO OPEN   LFTs remained normal. Patient is experienced some contractions and has been started on Indocin which is made them better. She wants to advance her diet today. Dr. Elesa MassedWard is planning a ultrasound for tomorrow.  Lattie Hawichard E Zakaria Sedor, MD, FACS  08/26/2016

## 2016-08-27 ENCOUNTER — Other Ambulatory Visit: Payer: Self-pay | Admitting: Maternal and Fetal Medicine

## 2016-08-27 ENCOUNTER — Inpatient Hospital Stay
Admit: 2016-08-27 | Discharge: 2016-08-27 | Disposition: A | Discharge status: Home / Self Care | Payer: Medicaid Other | Attending: Maternal and Fetal Medicine | Admitting: Maternal and Fetal Medicine

## 2016-08-27 ENCOUNTER — Encounter: Payer: Self-pay | Admitting: Surgery

## 2016-08-27 DIAGNOSIS — O42912 Preterm premature rupture of membranes, unspecified as to length of time between rupture and onset of labor, second trimester: Secondary | ICD-10-CM

## 2016-08-27 NOTE — Progress Notes (Signed)
Subjective:   She feels lethargic and puny this afternoon. She has no nausea or vomiting. She's had no fever. She has some mild abdominal discomfort in the area for incision. She has voided spontaneously and has had a bowel movement. She's tolerating a regular diet.  Vital signs in last 24 hours: Temp:  [97.5 F (36.4 C)-99.1 F (37.3 C)] 97.5 F (36.4 C) (09/18 1152) Pulse Rate:  [70-101] 70 (09/18 1152) Resp:  [18-20] 18 (09/18 1152) BP: (104-116)/(51-81) 109/61 (09/18 1152) SpO2:  [98 %-100 %] 100 % (09/18 1152) Weight:  [59 kg (130 lb)] 59 kg (130 lb) (09/18 0949) Last BM Date: 08/27/16  Intake/Output from previous day: 09/17 0701 - 09/18 0700 In: 240 [P.O.:240] Out: 950 [Emesis/NG output:950]  Exam:  Her abdomen is soft with some moderate incisional tenderness. There is no evidence of any wound problems. She has good bowel sounds.  Lab Results:  CBC  Recent Labs  08/25/16 0603 08/26/16 0523  WBC 6.6 7.0  HGB 9.5* 9.1*  HCT 26.8* 25.6*  PLT 158 162   CMP     Component Value Date/Time   NA 137 08/26/2016 0523   NA 136 10/18/2014 0800   K 3.1 (L) 08/26/2016 0523   K 3.1 (L) 10/18/2014 0800   CL 105 08/26/2016 0523   CL 100 10/18/2014 0800   CO2 25 08/26/2016 0523   CO2 27 10/18/2014 0800   GLUCOSE 107 (H) 08/26/2016 0523   GLUCOSE 116 (H) 10/18/2014 0800   BUN <5 (L) 08/26/2016 0523   BUN 5 (L) 10/18/2014 0800   CREATININE 0.52 08/26/2016 0523   CREATININE 0.73 10/18/2014 0800   CALCIUM 8.6 (L) 08/26/2016 0523   CALCIUM 8.5 10/18/2014 0800   PROT 5.9 (L) 08/26/2016 0523   PROT 7.9 10/18/2014 0800   ALBUMIN 2.8 (L) 08/26/2016 0523   ALBUMIN 3.4 10/18/2014 0800   AST 33 08/26/2016 0523   AST 27 10/18/2014 0800   ALT 18 08/26/2016 0523   ALT 38 10/18/2014 0800   ALKPHOS 39 08/26/2016 0523   ALKPHOS 66 10/18/2014 0800   BILITOT 0.7 08/26/2016 0523   BILITOT 0.6 10/18/2014 0800   GFRNONAA >60 08/26/2016 0523   GFRNONAA >60 07/26/2014 1815   GFRAA >60  08/26/2016 0523   GFRAA >60 07/26/2014 1815   PT/INR No results for input(s): LABPROT, INR in the last 72 hours.  Studies/Results: [place] Mfm Ob Limited  Result Date: 08/27/2016 OBSTETRICAL ULTRASOUND: This exam was performed within a [facility]. The OB US report was generated in the [facility] system, and faxed to the ordering physician.  This report is available in the [employer] PACS. See the [facility] Obstetric US report via the Image Link.   Assessment/Plan: Her laboratory values have remained stable. Her maternal fetal ultrasound this morning was unremarkable without evidence of hematoma. She does not feel comfortable going home this afternoon. We will increase her activity level and the hospital this afternoon and plan discharge tomorrow. She is in agreement with this plan.

## 2016-08-27 NOTE — Progress Notes (Signed)
19 +5 week s/p cholecystectomy and trochar placement into intrauterine cavity . MFM eval today with nl AFI and no evidence of hematoma formation .  Currently no LOF per vag  Tlerating liquids  O; VSS  lunds CTA  CV RRR  Abdomen: soft NT A: trochar injury to amniotic cavity with normal u/s today , afebeile  P: if gen sx d/c pt to home we will follow suite Precautions given to the pt .  Patient ID: Jessica Coffey, female   DOB: September 13, 1991, 25 y.o.   MRN: [id number]

## 2016-08-27 NOTE — Progress Notes (Signed)
Has been afebrile with VSS. Color good,skin w&d. BBS clear. Has denied Nausea although she has only taken in ine Apple Juice container of 120cc. She has voided twice. Has not had a B.M. Since Tuesday Sept. 12 as per her statement. Her Abd. Has very faint B.S., is Gravid and sl. Distended with tenderness to mild palpation. I instructed Pt. In the risk of Ileus and the importance of her walking. Pt. Tolerated a walk lasting appx. 15 minutes earlier this shift. She received a Dulcolax suppository but has not had a B.M. As yet. She has c/o incisional pain X2 and has had sstated relief from IV Dilaudid. Her FHT's have been WNL and Pt. States good Fetal movement. Pt. Denies contractions and/or vaginal beeding or discharge. Pt. Was wearing a Peri-Pad and that has been without drainage. I have encouraged Pt. To increase liquid intake and she V/O. Pt. States," I feel better than I did yesterday." She is alert and oriented with pleasant affect.  Appears to be resting well. Will cont. To monitor closely.

## 2016-08-28 LAB — SURGICAL PATHOLOGY

## 2016-08-28 MED ORDER — HYDROCODONE-ACETAMINOPHEN 5-325 MG PO TABS
1.0000 | ORAL_TABLET | Freq: Four times a day (QID) | ORAL | Status: DC | PRN
  Administered 2016-08-28: 2 via ORAL
  Filled 2016-08-28: qty 2

## 2016-08-28 MED ORDER — HYDROCODONE-ACETAMINOPHEN 5-325 MG PO TABS: 1.0000 | ORAL_TABLET | Freq: Four times a day (QID) | ORAL | 0 refills | Status: AC | PRN

## 2016-08-28 MED ORDER — POTASSIUM CHLORIDE CRYS ER 20 MEQ PO TBCR
20.0000 meq | EXTENDED_RELEASE_TABLET | Freq: Two times a day (BID) | ORAL | Status: DC
  Administered 2016-08-28: 20 meq via ORAL
  Filled 2016-08-28: qty 1

## 2016-08-28 NOTE — Progress Notes (Signed)
Pt seen, chart reviewed.   She is feeling better this am with incisional pain only.  No nausea or vomiting.  Wounds look good with no drainage.   Agree with dc plans.  We can follow in office in addition to OB.   Thanks for excellent assistance

## 2016-08-28 NOTE — Progress Notes (Signed)
4 Days Post-Op Procedure(s) (LRB): LAPAROSCOPIC CHOLECYSTECTOMY CONVERTED TO OPEN (N/A). S/p MFM ultrasound yesterday without evidence of placental pathology, normal fluid and visualized anatomy (full anatomy not evaluated).  Subjective: Patient reports incisional pain.  Appears fatigued but in no distress. Some mucus-colored discharge this morning but no blood or leaking fluid. Afebrile.  Objective: I have reviewed patient's vital signs, intake and output, medications and radiology results.  General: alert, cooperative, appears stated age and fatigued Resp: clear to auscultation bilaterally Cardio: regular rate and rhythm, S1, S2 normal, no murmur, click, rub or gallop GI: soft, non-tender; bowel sounds normal; no masses,  no organomegaly and fundus firm and non tender  Assessment: s/p Procedure(s): LAPAROSCOPIC CHOLECYSTECTOMY CONVERTED TO OPEN (N/A): stable  Plan: Discharge home Appears stable. Plan for possible d/c home today. Have transitioned to po pain medicines this morning and will assess pain level.  Her pregnancy appears stable: Plan for repeat full MFM ultrasound in 2 weeks. Pt to call OB with any fever, discharge, leaking fluid, bleeding.  LOS: 4 days    Jessica Coffey 08/28/2016, 8:25 AM

## 2016-08-28 NOTE — Discharge Instructions (Signed)
Please call to make appointment with MFM

## 2016-08-28 NOTE — Discharge Summary (Signed)
Physician Discharge Summary  Patient ID: Jessica Coffey MRN: [id number] DOB/AGE: 14-Jul-1991 25 y.o.  Admit date: 08/22/2016 Discharge date: 08/28/2016  Admission Diagnoses: - abdominal pain - previable pregnancy  Discharge Diagnoses:  Active Problems:   Abdominal pain affecting pregnancy, antepartum   Cholelithiasis affecting pregnancy in second trimester, antepartum   Nausea and vomiting in pregnancy   Cholecystolithiasis affecting pregnancy, antepartum   Uterus injury with open wound into cavity - Acute on chronic anemia - Hypokalemia - hypoalbuminemia - hyperglycemia  Discharged Condition: good  Hospital Course: Pt admitted with RUQ pain, found to have gall stones and 19wk pregnancy. She was taken to surgery after several days of expectant management did not relieve pain. Lap chole started and converted to open lap chole after placement of 5mm trocar through the myometrium. This was closed with a figure of eight stitch. Tocolytics for several days after surgery given. MFM u/s revealed normal fluid and normal placenta. At time of delivery, no evidence of PPROM or infection. Risks of both discussed with patient. Planned repeat MFM ultrasound in 2 weeks.   Consults: general surgery and MFM  Discharge Exam: Blood pressure (!) 111/57, pulse 76, temperature 98.2 F (36.8 C), temperature source Oral, resp. rate 20, weight 134 lb (60.8 kg), last menstrual period 03/24/2016, SpO2 100 %, unknown if currently breastfeeding. General appearance: cooperative Head: Normocephalic, without obvious abnormality, atraumatic Resp: clear to auscultation bilaterally Cardio: regular rate and rhythm, S1, S2 normal, no murmur, click, rub or gallop GI: soft, non-tender; bowel sounds normal; no masses,  no organomegaly - gravid, no fundal tenderness Extremities: extremities normal, atraumatic, no cyanosis or edema Pulses: 2+ and symmetric Skin: Skin color, texture, turgor normal. No rashes or  lesions  Disposition: 01-Home or Self Care     Medication List    STOP taking these medications   predniSONE 50 MG tablet Commonly known as:  DELTASONE     TAKE these medications   acetaminophen 500 MG tablet Commonly known as:  TYLENOL Take 1,000 mg by mouth every 6 (six) hours as needed for mild pain or moderate pain.   docusate sodium 100 MG capsule Commonly known as:  COLACE Take 1 capsule (100 mg total) by mouth 2 (two) times daily as needed for mild constipation.   ferrous gluconate 324 MG tablet Commonly known as:  FERGON Take 1 tablet (324 mg total) by mouth 2 (two) times daily with a meal.   HYDROcodone-acetaminophen 5-325 MG tablet Commonly known as:  NORCO/VICODIN Take 1-2 tablets by mouth every 6 (six) hours as needed for moderate pain or severe pain.   prenatal multivitamin Tabs tablet Take 1 tablet by mouth daily at 12 noon.        SignedChristeen Douglas: Parisa Pinela 08/28/2016, 5:16 PM

## 2016-09-06 ENCOUNTER — Encounter: Payer: Self-pay | Admitting: Surgery

## 2016-09-10 ENCOUNTER — Telehealth: Payer: Self-pay | Admitting: Surgery

## 2016-09-10 NOTE — Telephone Encounter (Signed)
Attempt to call patient on 09/06/2016 for a 8:00 appointment. Patient was a no show. Her number was disconnected. I also tried to call her mother, Renaldo HarrisonBetty Lindsey, at [phone] but this number wasn't working.

## 2016-09-11 NOTE — Telephone Encounter (Signed)
Call made to patient's pharmacy. 3 other numbers were obtained. 2 numbers were disconnected. 1 number was wrong number.  Spoke with Kriste BasqueBecky at Dr. Jolyn NapWard's office. [phone] They have been unable to contact patient. They have made multiple attempts as well with different numbers.   [facility]. Patient has not been seen there as well since 2016.

## 2016-09-16 ENCOUNTER — Observation Stay
Admission: EM | Admit: 2016-09-16 | Discharge: 2016-09-16 | Disposition: A | Discharge status: Home / Self Care | Payer: Medicaid Other | Attending: Obstetrics and Gynecology | Admitting: Obstetrics and Gynecology

## 2016-09-16 DIAGNOSIS — O26892 Other specified pregnancy related conditions, second trimester: Secondary | ICD-10-CM | POA: Diagnosis not present

## 2016-09-16 DIAGNOSIS — D649 Anemia, unspecified: Secondary | ICD-10-CM | POA: Diagnosis not present

## 2016-09-16 DIAGNOSIS — R109 Unspecified abdominal pain: Secondary | ICD-10-CM | POA: Diagnosis present

## 2016-09-16 DIAGNOSIS — Z3A22 22 weeks gestation of pregnancy: Secondary | ICD-10-CM | POA: Insufficient documentation

## 2016-09-16 DIAGNOSIS — E876 Hypokalemia: Secondary | ICD-10-CM | POA: Insufficient documentation

## 2016-09-16 DIAGNOSIS — O26899 Other specified pregnancy related conditions, unspecified trimester: Secondary | ICD-10-CM | POA: Diagnosis present

## 2016-09-16 LAB — BASIC METABOLIC PANEL
ANION GAP: 8 (ref 5–15)
BUN: <5 mg/dL — ABNORMAL LOW (ref 6–20)
CHLORIDE: 106 mmol/L (ref 101–111)
CO2: 23 mmol/L (ref 22–32)
Calcium: 8.3 mg/dL — ABNORMAL LOW (ref 8.9–10.3)
Creatinine, Ser: 0.33 mg/dL — ABNORMAL LOW (ref 0.44–1.00)
GFR calc Af Amer: >60 mL/min (ref >60)
GLUCOSE: 87 mg/dL (ref 65–99)
POTASSIUM: 2.8 mmol/L — AB (ref 3.5–5.1)
Sodium: 137 mmol/L (ref 135–145)

## 2016-09-16 LAB — CBC
HEMATOCRIT: 25.6 % — AB (ref 35.0–47.0)
HEMOGLOBIN: 9 g/dL — AB (ref 12.0–16.0)
MCH: 30.6 pg (ref 26.0–34.0)
MCHC: 35 g/dL (ref 32.0–36.0)
MCV: 87.4 fL (ref 80.0–100.0)
Platelets: 137 10*3/uL — ABNORMAL LOW (ref 150–440)
RBC: 2.93 MIL/uL — AB (ref 3.80–5.20)
RDW: 12.9 % (ref 11.5–14.5)
WBC: 9.5 10*3/uL (ref 3.6–11.0)

## 2016-09-16 LAB — URINALYSIS COMPLETE WITH MICROSCOPIC (ARMC ONLY)
Bilirubin Urine: NEGATIVE
Glucose, UA: NEGATIVE mg/dL
Nitrite: NEGATIVE
PH: 7 (ref 5.0–8.0)
PROTEIN: 100 mg/dL — AB
Specific Gravity, Urine: 1.009 (ref 1.005–1.030)

## 2016-09-16 MED ORDER — KCL-LACTATED RINGERS-D5W 20 MEQ/L IV SOLN
Freq: Once | INTRAVENOUS | Status: AC
  Administered 2016-09-16: 500 mL/h via INTRAVENOUS
  Filled 2016-09-16: qty 1000

## 2016-09-16 MED ORDER — TERBUTALINE SULFATE 1 MG/ML IJ SOLN
0.2500 mg | Freq: Once | INTRAMUSCULAR | Status: AC
  Administered 2016-09-16: 0.25 mg via SUBCUTANEOUS
  Filled 2016-09-16: qty 1

## 2016-09-16 MED ORDER — KCL-LACTATED RINGERS-D5W 20 MEQ/L IV SOLN: INTRAVENOUS | Status: DC

## 2016-09-16 NOTE — Discharge Summary (Signed)
  Progress Notes Date of Service: 09/16/2016 4:15 PM Suzy Bouchardhomas J Doye Montilla, MD  Obstetrics    [] Hide copied text [] Hover for attribution information Patient ID: Jessica Coffey, female   DOB: Dec 05, 1991, 25 y.o.   MRN: [id number] Jessica Coffey Dec 05, 1991 G3 P1 161w4d presents for lower abd pain  For 1 day   3 weeks ago underwent a l/s cholecystectomy complicated by uterine injury with entry into the endometrial cavity . U/s post op showed nl amniotic fluid . + ketones on CMP today . No food intake when presenting s/p 1 liter IVF with 20 meq of kcl  noLOF , no vaginal bleeding ,no fever  PMHX hypokalemia , cholelithiasis socail < No etoh , no tobacco  Allergies : NKDA O;BP (!) 113/59   Pulse (!) 102   Temp 98.2 F (36.8 C) (Oral)   Resp 16   Ht 5\' 5"  (1.651 m)   LMP 03/24/2016 (LMP Unknown)   SpO2 100%  ABDsoft NT  CX cx closed , no blood , no fluid on perineum  NST/efm 140, uterine irritability , Labs:hct  WBC=9.525.6( same as 3 weeks ago )plt = 137K ua TNTC wbc  , 2+ LE  K+3.1 A: abd pain c/w UTI .  No evidence of PPROM or chorioamnionitis  Anemia chronic  Hypokalemia  P:Macrobid bid x 7 days Ferrous sulfate 325 mg TID with OJ  K+ rich diet   Counseled about opintake     Electronically signed by

## 2016-09-16 NOTE — Progress Notes (Signed)
Patient ID: Jessica Coffey, female   DOB: 17-May-1991, 25 y.o.   MRN: [id number] Patient WU:JWJXBJ:Jessica Toni ArthursFuller, femaleDOB:17-May-1991, 25 y.o.YNW:[id number] Jessica Fuller08-Jun-1992G3P17849w4d presents for lower abd pain For 1 day  3 weeks ago underwent a l/s cholecystectomy complicated by uterine injury with entry into the endometrial cavity . U/s post op showed nl amniotic fluid . + ketones on CMP today . No food intake when presenting s/p 1 liter IVF with 20 meq of kcl  noLOF , novaginal bleeding ,no fever  PMHX hypokalemia , cholelithiasis socail <No etoh , no tobacco  Allergies : NKDA O;BP (!) 113/59  Pulse (!) 102  Temp 98.2 F (36.8 C) (Oral)  Resp 16  Ht 5\' 5"  (1.651 m)  LMP 03/24/2016 (LMP Unknown)  SpO2 100%  ABDsoft NT  CX cx closed , no blood , no fluid on perineum  NST/efm 140, uterine irritability , Labs:hct WBC=9.525.6( same as 3 weeks ago )plt = 137K ua TNTC wbc  , 2+ LE  K+3.1 A: abd pain c/w UTI .  No evidence of PPROM or chorioamnionitis  Anemia chronic  Hypokalemia  P:Macrobid bid x 7 days Ferrous sulfate 325 mg TID with OJ  K+ rich diet  Counseled about opintake     Electronically signed by

## 2016-09-16 NOTE — Progress Notes (Signed)
Patient ID: Jessica Coffey, female   DOB: Apr 25, 1991, 25 y.o.   MRN: [id number] Jessica Kaufmannomika Everingham Apr 25, 1991 G3 P1 3871w4d presents for lower abd pain  For 1 day   3 weeks ago underwent a l/s cholecystectomy complicated by uterine injury with entry into the endometrial cavity . U/s post op showed nl amniotic fluid . + ketones on CMP today . No food intake when presenting s/p 1 liter IVF with 20 meq of kcl  noLOF , no vaginal bleeding ,no fever  PMHX hypokalemia , cholelithiasis socail < No etoh , no tobacco  Allergies : NKDA O;BP (!) 113/59   Pulse (!) 102   Temp 98.2 F (36.8 C) (Oral)   Resp 16   Ht 5\' 5"  (1.651 m)   LMP 03/24/2016 (LMP Unknown)   SpO2 100%  ABDsoft NT  CX cx closed , no blood , no fluid on perineum  NST/efm 140, uterine irritability , Labs:hct  WBC=9.525.6( same as 3 weeks ago )plt = 137K ua TNTC wbc  , 2+ LE  K+3.1 A: abd pain c/w UTI .  No evidence of PPROM or chorioamnionitis  Anemia chronic  Hypokalemia  P:Macrobid bid x 7 days Ferrous sulfate 325 mg TID with OJ  K+ rich diet   Counseled about opintake

## 2016-09-16 NOTE — OB Triage Note (Signed)
Pt presents with c/o of constant lower abdominal pain that started this AM around 1030.  Denies burning with urination, but notes increase frequency.  Denies ctx, LOF or vaginal bleeding.  Reports + fetal mov't.

## 2016-09-20 ENCOUNTER — Encounter: Payer: Self-pay | Admitting: *Deleted

## 2016-09-20 ENCOUNTER — Inpatient Hospital Stay: Payer: Medicaid Other

## 2016-09-20 ENCOUNTER — Inpatient Hospital Stay
Admission: EM | Admit: 2016-09-20 | Discharge: 2016-09-20 | DRG: 781 | Disposition: A | Discharge status: Other Acute Inpatient Hospital | Payer: Medicaid Other | Attending: Obstetrics and Gynecology | Admitting: Obstetrics and Gynecology

## 2016-09-20 ENCOUNTER — Ambulatory Visit (HOSPITAL_COMMUNITY)
Admission: AD | Admit: 2016-09-20 | Discharge: 2016-09-20 | Disposition: A | Discharge status: Home / Self Care | Payer: Medicaid Other | Source: Other Acute Inpatient Hospital | Attending: Obstetrics and Gynecology | Admitting: Obstetrics and Gynecology

## 2016-09-20 DIAGNOSIS — Z3A23 23 weeks gestation of pregnancy: Secondary | ICD-10-CM | POA: Diagnosis not present

## 2016-09-20 DIAGNOSIS — R1031 Right lower quadrant pain: Secondary | ICD-10-CM | POA: Diagnosis present

## 2016-09-20 DIAGNOSIS — O99012 Anemia complicating pregnancy, second trimester: Secondary | ICD-10-CM | POA: Diagnosis present

## 2016-09-20 DIAGNOSIS — O26892 Other specified pregnancy related conditions, second trimester: Secondary | ICD-10-CM

## 2016-09-20 DIAGNOSIS — O2302 Infections of kidney in pregnancy, second trimester: Secondary | ICD-10-CM | POA: Diagnosis present

## 2016-09-20 DIAGNOSIS — D573 Sickle-cell trait: Secondary | ICD-10-CM | POA: Diagnosis present

## 2016-09-20 DIAGNOSIS — Z3492 Encounter for supervision of normal pregnancy, unspecified, second trimester: Secondary | ICD-10-CM

## 2016-09-20 DIAGNOSIS — R109 Unspecified abdominal pain: Secondary | ICD-10-CM

## 2016-09-20 DIAGNOSIS — O0932 Supervision of pregnancy with insufficient antenatal care, second trimester: Secondary | ICD-10-CM

## 2016-09-20 DIAGNOSIS — Z3A00 Weeks of gestation of pregnancy not specified: Secondary | ICD-10-CM | POA: Insufficient documentation

## 2016-09-20 DIAGNOSIS — O2690 Pregnancy related conditions, unspecified, unspecified trimester: Secondary | ICD-10-CM | POA: Insufficient documentation

## 2016-09-20 DIAGNOSIS — R1011 Right upper quadrant pain: Secondary | ICD-10-CM

## 2016-09-20 LAB — URINALYSIS COMPLETE WITH MICROSCOPIC (ARMC ONLY)
BILIRUBIN URINE: NEGATIVE
Bilirubin Urine: NEGATIVE
GLUCOSE, UA: NEGATIVE mg/dL
Glucose, UA: NEGATIVE mg/dL
Ketones, ur: NEGATIVE mg/dL
NITRITE: POSITIVE — AB
Nitrite: NEGATIVE
PH: 6 (ref 5.0–8.0)
PROTEIN: 30 mg/dL — AB
Protein, ur: NEGATIVE mg/dL
SPECIFIC GRAVITY, URINE: 1.011 (ref 1.005–1.030)
Specific Gravity, Urine: 1.008 (ref 1.005–1.030)
pH: 7 (ref 5.0–8.0)

## 2016-09-20 LAB — CBC WITH DIFFERENTIAL/PLATELET
BASOS ABS: 0 10*3/uL (ref 0–0.1)
BASOS ABS: 0 10*3/uL (ref 0–0.1)
BASOS PCT: 0 %
Basophils Relative: 1 %
EOS ABS: 0 10*3/uL (ref 0–0.7)
EOS ABS: 0 10*3/uL (ref 0–0.7)
EOS PCT: 0 %
EOS PCT: 0 %
HCT: 26.2 % — ABNORMAL LOW (ref 35.0–47.0)
HCT: 24.3 % — ABNORMAL LOW (ref 35.0–47.0)
Hemoglobin: 9.1 g/dL — ABNORMAL LOW (ref 12.0–16.0)
Hemoglobin: 8.4 g/dL — ABNORMAL LOW (ref 12.0–16.0)
LYMPHS PCT: 8 %
Lymphocytes Relative: 10 %
Lymphs Abs: 0.9 10*3/uL — ABNORMAL LOW (ref 1.0–3.6)
Lymphs Abs: 0.8 10*3/uL — ABNORMAL LOW (ref 1.0–3.6)
MCH: 30.7 pg (ref 26.0–34.0)
MCH: 30.3 pg (ref 26.0–34.0)
MCHC: 35 g/dL (ref 32.0–36.0)
MCHC: 34.5 g/dL (ref 32.0–36.0)
MCV: 87.9 fL (ref 80.0–100.0)
MCV: 87.9 fL (ref 80.0–100.0)
MONO ABS: 0.5 10*3/uL (ref 0.2–0.9)
MONO ABS: 0.8 10*3/uL (ref 0.2–0.9)
Monocytes Relative: 6 %
Monocytes Relative: 8 %
Neutro Abs: 7.4 10*3/uL — ABNORMAL HIGH (ref 1.4–6.5)
Neutro Abs: 8.1 10*3/uL — ABNORMAL HIGH (ref 1.4–6.5)
Neutrophils Relative %: 84 %
Neutrophils Relative %: 83 %
PLATELETS: 161 10*3/uL (ref 150–440)
PLATELETS: 157 10*3/uL (ref 150–440)
RBC: 2.98 MIL/uL — ABNORMAL LOW (ref 3.80–5.20)
RBC: 2.76 MIL/uL — AB (ref 3.80–5.20)
RDW: 12.9 % (ref 11.5–14.5)
RDW: 13 % (ref 11.5–14.5)
WBC: 8.9 10*3/uL (ref 3.6–11.0)
WBC: 9.8 10*3/uL (ref 3.6–11.0)

## 2016-09-20 LAB — URINE DRUG SCREEN, QUALITATIVE (ARMC ONLY)
Amphetamines, Ur Screen: NOT DETECTED
Barbiturates, Ur Screen: NOT DETECTED
Benzodiazepine, Ur Scrn: NOT DETECTED
CANNABINOID 50 NG, UR ~~LOC~~: POSITIVE — AB
COCAINE METABOLITE, UR ~~LOC~~: NOT DETECTED
MDMA (ECSTASY) UR SCREEN: NOT DETECTED
Methadone Scn, Ur: NOT DETECTED
Opiate, Ur Screen: NOT DETECTED
Phencyclidine (PCP) Ur S: NOT DETECTED
TRICYCLIC, UR SCREEN: NOT DETECTED

## 2016-09-20 LAB — COMPREHENSIVE METABOLIC PANEL
ALBUMIN: 3.2 g/dL — AB (ref 3.5–5.0)
ALT: 8 U/L — ABNORMAL LOW (ref 14–54)
AST: 14 U/L — AB (ref 15–41)
Alkaline Phosphatase: 50 U/L (ref 38–126)
Anion gap: 7 (ref 5–15)
CHLORIDE: 107 mmol/L (ref 101–111)
CO2: 23 mmol/L (ref 22–32)
Calcium: 8.6 mg/dL — ABNORMAL LOW (ref 8.9–10.3)
Creatinine, Ser: 0.38 mg/dL — ABNORMAL LOW (ref 0.44–1.00)
GFR calc Af Amer: >60 mL/min (ref >60)
GLUCOSE: 88 mg/dL (ref 65–99)
POTASSIUM: 3.2 mmol/L — AB (ref 3.5–5.1)
SODIUM: 137 mmol/L (ref 135–145)
Total Bilirubin: 0.1 mg/dL — ABNORMAL LOW (ref 0.3–1.2)
Total Protein: 6.9 g/dL (ref 6.5–8.1)

## 2016-09-20 LAB — WET PREP, GENITAL
Clue Cells Wet Prep HPF POC: NONE SEEN
SPERM: NONE SEEN
Trich, Wet Prep: NONE SEEN
YEAST WET PREP: NONE SEEN

## 2016-09-20 LAB — RAPID HIV SCREEN (HIV 1/2 AB+AG)
HIV 1/2 Antibodies: NONREACTIVE
HIV-1 P24 ANTIGEN - HIV24: NONREACTIVE

## 2016-09-20 LAB — CHLAMYDIA/NGC RT PCR (ARMC ONLY)
CHLAMYDIA TR: NOT DETECTED
N GONORRHOEAE: NOT DETECTED

## 2016-09-20 MED ORDER — ACETAMINOPHEN 500 MG PO TABS
1000.0000 mg | ORAL_TABLET | Freq: Four times a day (QID) | ORAL | Status: DC | PRN
  Administered 2016-09-20: 1000 mg via ORAL
  Filled 2016-09-20: qty 2

## 2016-09-20 MED ORDER — FERROUS GLUCONATE 324 (38 FE) MG PO TABS
324.0000 mg | ORAL_TABLET | Freq: Two times a day (BID) | ORAL | Status: DC
  Administered 2016-09-20: 324 mg via ORAL
  Filled 2016-09-20: qty 1

## 2016-09-20 MED ORDER — PRENATAL MULTIVITAMIN CH: 1.0000 | ORAL_TABLET | Freq: Every day | ORAL | Status: DC

## 2016-09-20 MED ORDER — BETAMETHASONE SOD PHOS & ACET 6 (3-3) MG/ML IJ SUSP
12.0000 mg | INTRAMUSCULAR | Status: DC
  Administered 2016-09-20: 12 mg via INTRAMUSCULAR
  Filled 2016-09-20: qty 1
  Filled 2016-09-20: qty 2

## 2016-09-20 MED ORDER — DEXTROSE 5 % IV SOLN
1.0000 g | INTRAVENOUS | Status: DC
  Administered 2016-09-20: 1 g via INTRAVENOUS
  Filled 2016-09-20 (×2): qty 10

## 2016-09-20 MED ORDER — DOCUSATE SODIUM 100 MG PO CAPS: 100.0000 mg | ORAL_CAPSULE | Freq: Two times a day (BID) | ORAL | Status: DC | PRN

## 2016-09-20 MED ORDER — ZOLPIDEM TARTRATE 5 MG PO TABS: 5.0000 mg | ORAL_TABLET | Freq: Every evening | ORAL | Status: DC | PRN

## 2016-09-20 MED ORDER — CALCIUM CARBONATE ANTACID 500 MG PO CHEW: 2.0000 | CHEWABLE_TABLET | ORAL | Status: DC | PRN

## 2016-09-20 MED ORDER — FLEET ENEMA 7-19 GM/118ML RE ENEM
1.0000 | ENEMA | Freq: Once | RECTAL | Status: AC
  Administered 2016-09-20: 1 via RECTAL

## 2016-09-20 MED ORDER — DEXTROSE IN LACTATED RINGERS 5 % IV SOLN
INTRAVENOUS | Status: DC
  Administered 2016-09-20: 1000 mL via INTRAVENOUS
  Administered 2016-09-20 (×2): via INTRAVENOUS

## 2016-09-20 MED ORDER — BUTORPHANOL TARTRATE 1 MG/ML IJ SOLN
1.0000 mg | INTRAMUSCULAR | Status: DC | PRN
  Administered 2016-09-20 (×7): 1 mg via INTRAVENOUS
  Filled 2016-09-20 (×7): qty 1

## 2016-09-20 MED ORDER — POTASSIUM CHLORIDE 20 MEQ PO PACK
20.0000 meq | PACK | Freq: Two times a day (BID) | ORAL | Status: DC
  Administered 2016-09-20: 20 meq via ORAL
  Filled 2016-09-20 (×2): qty 1

## 2016-09-20 NOTE — Progress Notes (Signed)
Patient ID: Jessica Coffey, female   DOB: 1991/04/21, 25 y.o.   MRN: [id number] See discharge summary

## 2016-09-20 NOTE — Progress Notes (Signed)
Bluford Kaufmannomika Pritz is a 25 y.o. G3P2002 at 4865w1d with severe abdominal pain, localized on right side.  Subjective: 10/10 pain x1 day, worsening overnight. Good fetal movement, no vaginal discharge, bleeding. No nausea/anorexia, vomiting, dysuria, constipation (though told overnight staff no BM in days and is now s/p Fleets enema with +BM). No SOB or chest pain. No HA. No contractions.  +abdmoinal pain with deep breaths  Hx significant for 3 weeks ago lap chole turned open for 5mm trocar into uterine cavity with rupture of membranes. No abx given but no s/s infection since. Tocolytics after surgery.  Drug screen negative except for THC. No leukocytosis. UA with +blood, +nitrites and bacteruria. Potassium 3.2. Hgb 9.1.  Objective: BP 105/62 (BP Location: Left Arm)   Pulse 98   Temp 98.5 F (36.9 C) (Oral)   Resp 18   LMP 03/24/2016 (LMP Unknown)  I/O last 3 completed shifts: In: 262.5 [I.V.:262.5] Out: -  No intake/output data recorded.  FHT:  Heard at 150s. Fetal movement noted visually during exam. UC:   none SVE:    closed.thick.high and non tender. No cervical tenderness. No bladder tenderness.  NAD: Mild distress, no sweating, chills. Fully attentive. No trouble with speaking. Abd: Diffusely tender, right side worse than left, RLQ worse than RUQ. No rebound, +rovsing. Minimal fundal tenderness and seems more related to peritoneum than uterus.  Back: RIGHT SIDED CVA  Labs: Lab Results  Component Value Date   WBC 8.9 09/20/2016   HGB 9.1 (L) 09/20/2016   HCT 26.2 (L) 09/20/2016   MCV 87.9 09/20/2016   PLT 161 09/20/2016    Assessment / Plan:  Severe abdominal pain of unknown etiology. DDx is wide and includes infectious etiologies including  pyleonephritis, cystitis, intrauterine infection, vaginitis. Appendicitis. Postop surgical complication from open chole. Kidney stone - Collected and pending:  - Wet mount, gc/ct, urine culture, blood culture - ultrasound for growth,  fluid - RUQ ultrasound for eval of surgical site.  - will give ceftriaxone 1g q24hrs emperically  Current dx is cystitis/pyleonephritis, though dx if murky in absence of fever/nausea. +CVA tenderness and bacturia. Will begin abx. If no improvement, will consider renal u/s for stone. May also need MRI to r/o appendicitis if no improvement.   Christeen DouglasBEASLEY, Laylah Riga 09/20/2016, 9:36 AM

## 2016-09-20 NOTE — Progress Notes (Signed)
Talked with matt at [facility] regarding transport; report given to matt; RN got a new set of VS and FHT at this time

## 2016-09-20 NOTE — Progress Notes (Signed)
Jessica Coffey (and 2 others) from [facility] are here to transport pt to [facility]; RN gave report on more current VS and FHT and gave paperwork to [facility] members; RN called and gave report to Seward GraterMaggie at [facility]

## 2016-09-20 NOTE — Discharge Summary (Addendum)
Obstetric Discharge Summary/Transfer to [facility]  25yo G3P2002 at 23+1wks with incomplete prenatal care  Pt admitted overnight with significant 10/10 abdominal and right flank pain. No vaginal sx. No dysuria or n/v. She was seen 2 days ago and treated with macrobid with some relief in sx until last night.  Labs normal except for UA with suggestion of infection on clean catch, though straight cath with +hgb, +leukocytes, neg nitrites and rare bacteria with +WBCx. Cultures on clean catch and urine straight cath pending. Urine drug screen +for THC.  Ultrasound today found EFW 570g with 5.8cm pocket of fluid, with large complex mobile area seen in uterine fundus within gestational sac. Pt has hx of intra-uterine trocar injury during lap chole on 08/24/16.  RUQ ultrasound revealed significant right hydronephrosis with right ureter dilation.   During the day today, she has developed a fever to 102 orally, with elevated RR to 23 and HR 128. Now meeting SIRS criteria, the decision was made to transfer. WBC remains normal at 9.8.  She was given one dose of ceftriaxone for possible pyleo at 12:48 this afternoon based on clinical exam. This evening, her fundus is tender to touch more than this morning.  Component     Latest Ref Rng & Units 08/22/2016 08/25/2016 08/26/2016 09/16/2016             WBC     3.6 - 11.0 K/uL 5.8 6.6 7.0 9.5  RBC     3.80 - 5.20 MIL/uL 3.12 (L) 3.15 (L) 2.99 (L) 2.93 (L)  Hemoglobin     12.0 - 16.0 g/dL 9.5 (L) 9.5 (L) 9.1 (L) 9.0 (L)  HCT     35.0 - 47.0 % 26.8 (L) 26.8 (L) 25.6 (L) 25.6 (L)  MCV     80.0 - 100.0 fL 85.9 85.1 85.6 87.4  MCH     26.0 - 34.0 pg 30.4 30.3 30.6 30.6  MCHC     32.0 - 36.0 g/dL 16.1 09.6 04.5 40.9  RDW     11.5 - 14.5 % 13.8 13.3 13.8 12.9  Platelets     150 - 440 K/uL 186 158 162 137 (L)  Neutrophils     % 65 82 86   Lymphocytes     % 25 13 10    Monocytes Relative     % 6 4 3    Eosinophil     % 4 1 1    Basophil     % 0 0 0    NEUT#     1.4 - 6.5 K/uL 3.8 5.4 6.1   Lymphocyte #     1.0 - 3.6 K/uL 1.4 0.9 (L) 0.7 (L)   Monocyte #     0.2 - 0.9 K/uL 0.3 0.3 0.2   Eosinophils Absolute     0 - 0.7 K/uL 0.2 0.1 0.1   Basophils Absolute     0 - 0.1 K/uL 0.0 0.0 0.0   Smear Review           HGB     12.0 - 16.0 g/dL      HCT     81.1 - 91.4 %       Component     Latest Ref Rng & Units 09/20/2016 09/20/2016         4:03 AM  7:31 PM  WBC     3.6 - 11.0 K/uL 8.9 9.8  RBC     3.80 - 5.20 MIL/uL 2.98 (L) 2.76 (L)  Hemoglobin  12.0 - 16.0 g/dL 9.1 (L) 8.4 (L)  HCT     35.0 - 47.0 % 26.2 (L) 24.3 (L)  MCV     80.0 - 100.0 fL 87.9 87.9  MCH     26.0 - 34.0 pg 30.7 30.3  MCHC     32.0 - 36.0 g/dL 16.135.0 09.634.5  RDW     04.511.5 - 14.5 % 12.9 13.0  Platelets     150 - 440 K/uL 161 157  Neutrophils     % 84 83  Lymphocytes     % 10 8  Monocytes Relative     % 6 8  Eosinophil     % 0 0  Basophil     % 0 1  NEUT#     1.4 - 6.5 K/uL 7.4 (H) 8.1 (H)  Lymphocyte #     1.0 - 3.6 K/uL 0.9 (L) 0.8 (L)  Monocyte #     0.2 - 0.9 K/uL 0.5 0.8  Eosinophils Absolute     0 - 0.7 K/uL 0.0 0.0  Basophils Absolute     0 - 0.1 K/uL 0.0 0.0  Smear Review         HGB     12.0 - 16.0 g/dL    HCT     40.935.0 - 81.147.0 %       Hemoglobin  Date Value Ref Range Status  09/20/2016 8.4 (L) 12.0 - 16.0 g/dL Final   HGB  Date Value Ref Range Status  10/18/2014 10.8 (L) 12.0 - 16.0 g/dL Final   HCT  Date Value Ref Range Status  09/20/2016 24.3 (L) 35.0 - 47.0 % Final  10/18/2014 32.3 (L) 35.0 - 47.0 % Final    Decision made to transfer peri-viable patient to [facility] given worsening clinical status. MD-to-MD transfer arranged.  - s/p BMZ x1 given prior to transfer - no magnesium started prior to transfer. - no amp/gent started prior to transfer - ultrasound from today and recent progress notes printed for transfer  Discussion of decision and recommendation with patient and her mother by phone. They are in  agreement.  Christeen DouglasBEASLEY, Jessica Coffey 09/20/2016, 8:24 PM

## 2016-09-20 NOTE — H&P (Signed)
Obstetrics Admission History & Physical  Referring Provider: None Primary OBGYN :[facility] and seen by [facility] occasionally   Chief Complaint:  History of Present Illness  25 y.o. [id number] @ [redacted]w[redacted]d with LMP of and EDD of with occas PNC this pregnancy at [facility].   (Dating per [facility] but, no LMP or [place] found). : . Pregnancy complicated AV:[id number] and complication of uterine injury.   [name] presents for RLQ pain radiating to the Rt side and RUQ but, pain also can be on the Lt side. Pt rates the pain a 10 out of 10 and was seen for same complaints on 09/16/16. No ROM or fever, aching, chills or sweats. Pt has not had a BM in 1 week.  Review of Systems: Positive for abd pain, constipation, +gas.   Otherwise, her 12 point review of systems is negative or as noted in the History of Present Illness. Denies any trauma, fall and feels the pain has not resolved since being seen on 09/16/16. Pt states, "pain is sharp and constant and cannot rest". Denies nausea, vomiting, fever, chills, aching, sweats, urinary complaints or vaginal complaints. Pt states she did well after the surgery 3.5 weeks ago and now feels like she cannot get rid of this pain.   Patient Active Problem List   Diagnosis Date Noted  . Uterus injury with open wound into cavity 08/26/2016  . Cholecystolithiasis affecting pregnancy, antepartum 08/25/2016  . Cholelithiasis affecting pregnancy in second trimester, antepartum 08/23/2016  . Nausea and vomiting in pregnancy   . Back pain affecting pregnancy in third trimester 12/12/2015  . Supervision of normal pregnancy in third trimester 12/08/2015  . Benign gestational thrombocytopenia in third trimester (HCC) 11/20/2015  . Abdominal pain in pregnancy 11/06/2015  . Abdominal pain affecting pregnancy, antepartum 10/18/2015  . Abdominal pain affecting pregnancy 09/12/2015     PMHx:  Past Medical History:  Diagnosis Date  . Anemia   . Gallstones   . Oligohydramnios   . Sickle cell  trait (HCC)   . UTI (lower urinary tract infection)   Hypokalemia, Hypoalbuminemia, Hyperglycemia  PSHx:  Past Surgical History:  Procedure Laterality Date  . CHOLECYSTECTOMY N/A 08/24/2016   Procedure: LAPAROSCOPIC CHOLECYSTECTOMY CONVERTED TO OPEN;  Surgeon: Lattie Haw, MD;  Location: [facility] ORS;  Service: General;  Laterality: N/A;   Medications:  Prescriptions Prior to Admission  Medication Sig Dispense Refill Last Dose  . acetaminophen (TYLENOL) 500 MG tablet Take 1,000 mg by mouth every 6 (six) hours as needed for mild pain or moderate pain.   09/20/2016 at Unknown time  . docusate sodium (COLACE) 100 MG capsule Take 1 capsule (100 mg total) by mouth 2 (two) times daily as needed for mild constipation. (Patient not taking: Reported on 09/20/2016) 60 capsule 1 Not Taking at Unknown time  . ferrous gluconate (FERGON) 324 MG tablet Take 1 tablet (324 mg total) by mouth 2 (two) times daily with a meal. (Patient not taking: Reported on 09/20/2016) 60 tablet 3 Not Taking at Unknown time  . HYDROcodone-acetaminophen (NORCO/VICODIN) 5-325 MG tablet Take 1-2 tablets by mouth every 6 (six) hours as needed for moderate pain or severe pain. (Patient not taking: Reported on 09/20/2016) 15 tablet 0 Not Taking at Unknown time  . Prenatal Vit-Fe Fumarate-FA (PRENATAL MULTIVITAMIN) TABS tablet Take 1 tablet by mouth daily at 12 noon.   Not Taking at Unknown time   Allergies: has No Known Allergies. OBHx:  OB History  Gravida Para Term Preterm AB Living  3 2 2  0 0 2  SAB TAB Ectopic Multiple Live Births  0 0 0 0 2    # Outcome Date GA Lbr Len/2nd Weight Sex Delivery Anes PTL Lv  3 Current           2 Term 12/14/15 2173w0d  7 lb 9 oz (3.43 kg) M Vag-Spont  N LIV  1 Term 08/16/09 3522w0d  7 lb (3.175 kg) F   N LIV    Obstetric Comments  12/14/15: Induction of labor at 39 weeks for oligohydramnios    GYNHx:  History of abnormal pap smears: None found History of STIs: None reported     FHx: History  reviewed. No pertinent family history. Soc Hx:  Social History   Social History  . Marital status: Single    Spouse name: N/A  . Number of children: N/A  . Years of education: N/A   Occupational History  . Not on file.   Social History Main Topics  . Smoking status: Never Smoker  . Smokeless tobacco: Never Used  . Alcohol use No  . Drug use: No  . Sexual activity: Yes   Other Topics Concern  . Not on file   Social History Narrative  . No narrative on file   FOB: not with pt   Objective   Vitals:   09/20/16 0251  BP: 105/62  Pulse: 98  Resp: 18  Temp: 98.8 F (37.1 C)   Temp:  [98.8 F (37.1 C)] 98.8 F (37.1 C) (10/12 0251) Pulse Rate:  [98] 98 (10/12 0251) Resp:  [18] 18 (10/12 0251) BP: (105)/(62) 105/62 (10/12 0251) Temp (24hrs), Avg:98.8 F (37.1 C), Min:98.8 F (37.1 C), Max:98.8 F (37.1 C)  No intake or output data in the 24 hours ending 09/20/16 0342                                        EFM: 150, difficulty tracing fetus due to age Toco: No UC's noted  General: Well nourished, well developed female in no acute distress.  Skin:  Warm and dry.  Cardiovascular: S1S2, RRR, No M/R/G. Respiratory:  Clear to auscultation bilateral. Normal respiratory effort. No W/R/R. Abdomen: TTP in RLQ, RUQ, Rt side of pt, +TTP on Lt side but, not at the LUQ or LLQ.  Neuro/Psych:  Normal mood and affect.    SVE: deferred Leopolds/EFW: 1 pound 2 oz  Labs  H&H from 09/16/16: 9/25.6 Ultrasounds: [facility] saw pt 2 weeks ago and cleared her US   Perinatal info  O POS/ Rubella  / RPR HIV HepB Surf Ag /TDaP / Flu /pap 04/18/14 Flu 07/2016  Assessment & Plan   25 y.o. [id number] @ 3731w1d with signs and symptoms, with  IUP at 23 1/7 weeks with Abd pain GBS: unknown Analgesia: Stadol IV   Sharee Pimplearon W. Oneda Duffett, MSN, CNM, FNP [facility]

## 2016-09-20 NOTE — Progress Notes (Signed)
RN on phone with [facility] to set up transport; spoke with rhonda to get transport set up; [facility]

## 2016-09-20 NOTE — OB Triage Note (Signed)
Pt taken to ultrasound by wheelchair by hospital orderly.

## 2016-09-20 NOTE — OB Triage Note (Signed)
Patient states she has had right side/ Right side of back and Right lower abdomen radiating to right leg since yesterday but it became worse this morning at 0100 waking her up. Pt stats 10/10 dull and stabbing pain, tender to touch, and worse with walking and movement

## 2016-09-21 LAB — BLOOD CULTURE ID PANEL (REFLEXED)
ACINETOBACTER BAUMANNII: NOT DETECTED
CANDIDA ALBICANS: NOT DETECTED
CANDIDA KRUSEI: NOT DETECTED
CANDIDA PARAPSILOSIS: NOT DETECTED
CANDIDA TROPICALIS: NOT DETECTED
CARBAPENEM RESISTANCE: NOT DETECTED
Candida glabrata: NOT DETECTED
ENTEROBACTERIACEAE SPECIES: DETECTED — AB
Enterobacter cloacae complex: NOT DETECTED
Enterococcus species: NOT DETECTED
Escherichia coli: DETECTED — AB
Haemophilus influenzae: NOT DETECTED
KLEBSIELLA OXYTOCA: NOT DETECTED
KLEBSIELLA PNEUMONIAE: NOT DETECTED
Listeria monocytogenes: NOT DETECTED
Neisseria meningitidis: NOT DETECTED
PROTEUS SPECIES: NOT DETECTED
PSEUDOMONAS AERUGINOSA: NOT DETECTED
STAPHYLOCOCCUS AUREUS BCID: NOT DETECTED
STREPTOCOCCUS PYOGENES: NOT DETECTED
Serratia marcescens: NOT DETECTED
Staphylococcus species: NOT DETECTED
Streptococcus agalactiae: NOT DETECTED
Streptococcus pneumoniae: NOT DETECTED
Streptococcus species: NOT DETECTED

## 2016-09-21 LAB — CULTURE, BETA STREP (GROUP B ONLY)

## 2016-09-21 NOTE — Progress Notes (Signed)
Lab called to report BCID positive GNR in anaerobic bottle of 1 set identified as E. Coli, KPC not detected. Spoke with Jessica Coffey, RPh at [facility]. She said she doesn't do clinical and couldn't accept the results. She suggested I have my provider call her provider to transfer the information. I spoke with [facility] RN Waynetta SandyBeth, who said [facility]. She gave me the number to the RN she transferred to, WeedMaggie, [phone]. Jessica Coffey accepted the information and said she would let her prescribers know.  Jessica Coffey, Vermont[place]arm.D., BCPS Clinical Pharmacist 09/21/2016 [phone]

## 2016-09-22 LAB — URINE CULTURE: Culture: >=100000 — AB

## 2016-09-23 LAB — CULTURE, BLOOD (ROUTINE X 2)

## 2016-09-25 LAB — CULTURE, BLOOD (ROUTINE X 2): Culture: NO GROWTH

## 2016-09-25 NOTE — Op Note (Signed)
Bluford Kaufmannomika Prisco PROCEDURE DATE: 08/24/16  PATIENT:  [name]  25 y.o. female  PRE-OPERATIVE DIAGNOSIS:  Biliary colic with intractable nausea and vomiting at [redacted] weeks pregnant, intraoperative uterine injury  POST-OPERATIVE DIAGNOSIS: same  PROCEDURE:  Repair of uterine wall perforation  SURGEON:  Ranae Plumberhelsea Candi Profit, MD ASSIST: Christeen DouglasBethany Beasley, MD  ANESTHESIA:  General via ET  FINDINGS:  Gravid uterus with 5mm trochar inserted into myometrium approximately 2cm from the fundus Normal upper abdomen.  SPECIMEN: none  COMPLICATIONS: none apparent  DISPOSITION: vital signs stable to PACU   Indication for Surgery: 25 y.o. G3P2002 at 19+2 EGA with intractable nausea and vomiting who was brought to the OR for laparoscopic cholecystectomy.  See Dr Earmon Phoenixooper's OP NOTE for details for all other components of the case.  Procedure: I was called STAT into the OR to assist with an intraoperative uterine injury.  In brief, Dr. Excell Seltzerooper started his umbilical incision, aiming cephalad to avoid the uterus, inserted the 5mm trochar.  Upon insertion of the camera he noted he was in the uterine cavity, and with removal of the camera, amniotic fluid was escaping from the trochar opening.  He had divided the skin and fascia superiorly in the midline to visualize uterine fundus.  The trochar was removed and two alis clamps were placed on the edge of the port site of the uterus.  The myometrium was tented upward and a figure-of-eight with 2-0 PDS was placed and tied down.  No fluid was seen escaping from the entry site.  The site was hemostatic.  The attention was then turned to the upper abdomen, where the midline incision was extended superiorly, and the gall bladder was grasped, isolated, vessels clamped and transected.  Reassessment of the uterine site was performed and again there was no evidence of amniotic fluid leakage, even with application of fundal pressure.    The remainder of case was completed by Dr.  Excell Seltzerooper and his team.  Fetal hearts were assessed following the procedure, and arrangements were made for a MFM ultrasound to be done to assess for intact fetal membranes.  ---- Ranae Plumberhelsea Dequann Vandervelden, MD Attending Obstetrician and Gynecologist [facility]

## 2016-10-04 IMAGING — US US PELV - US TRANSVAGINAL
1 series · 13 of 25 positions shown · non-contrast
Comparison: CT abdomen/ pelvis earlier this day. Pelvic ultrasound
02/01/2014

CLINICAL DATA: Right-sided adnexal pain.

EXAM:
TRANSABDOMINAL AND TRANSVAGINAL ULTRASOUND OF PELVIS
TECHNIQUE: Both transabdominal and transvaginal ultrasound examinations of the
pelvis were performed. Transabdominal technique was performed for
global imaging of the pelvis including uterus, ovaries, adnexal
regions, and pelvic cul-de-sac. It was necessary to proceed with
endovaginal exam following the transabdominal exam to visualize the
uterus, endometrium, right and left ovary.

[Series 1: us pelv - us transvaginal · 0.18mm/px · 13 of 38 slices shown]
[im 1/38]
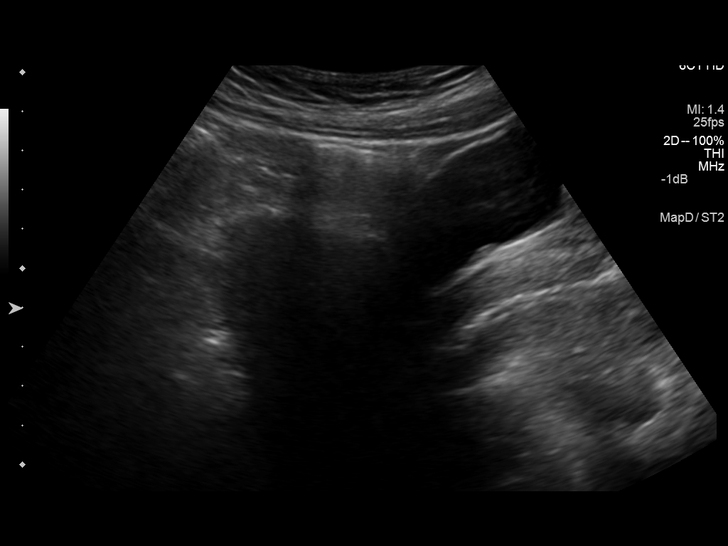
[im 4/38]
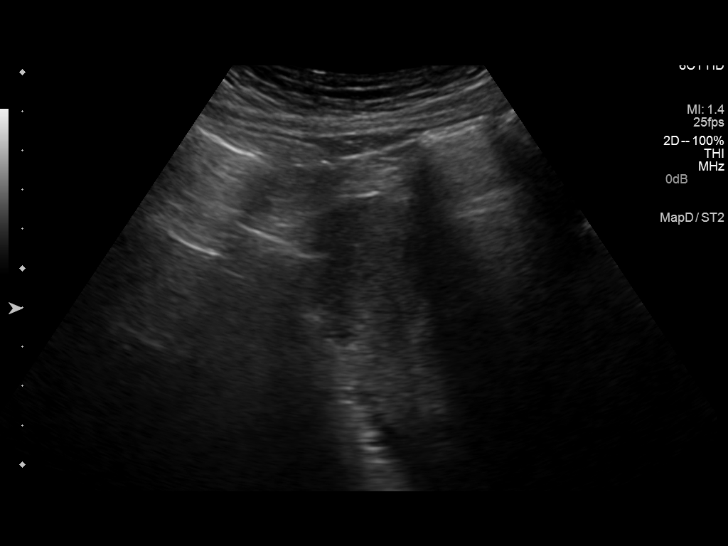
[im 7/38]
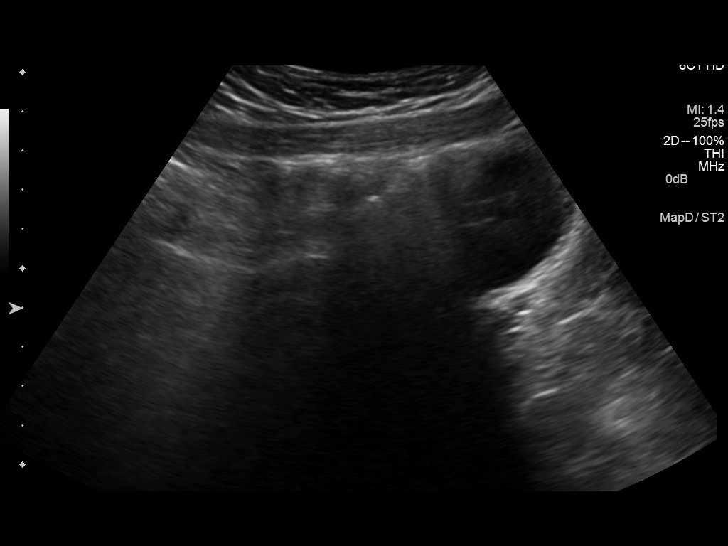
[im 10/38]
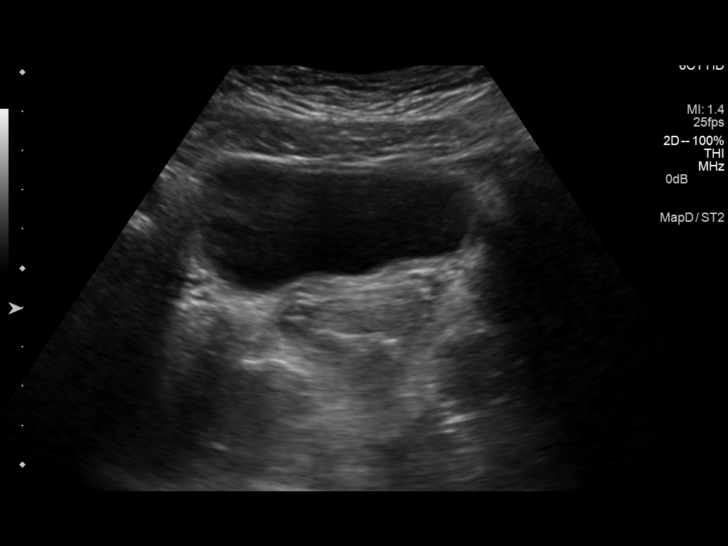
[im 13/38]
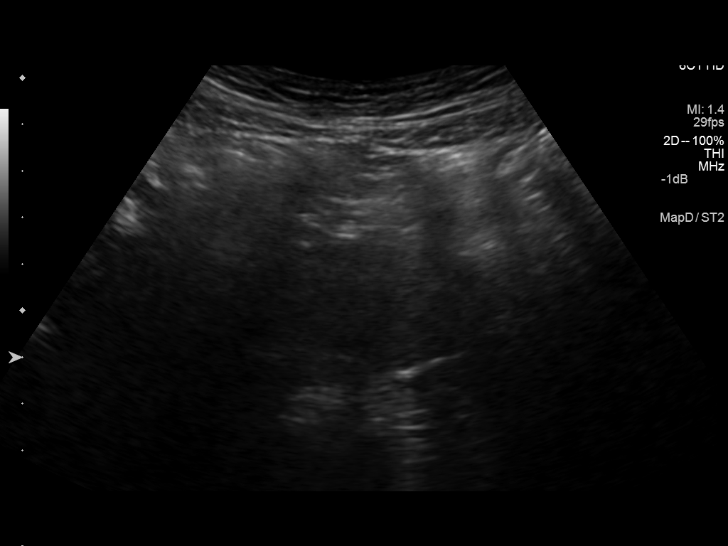
[im 16/38]
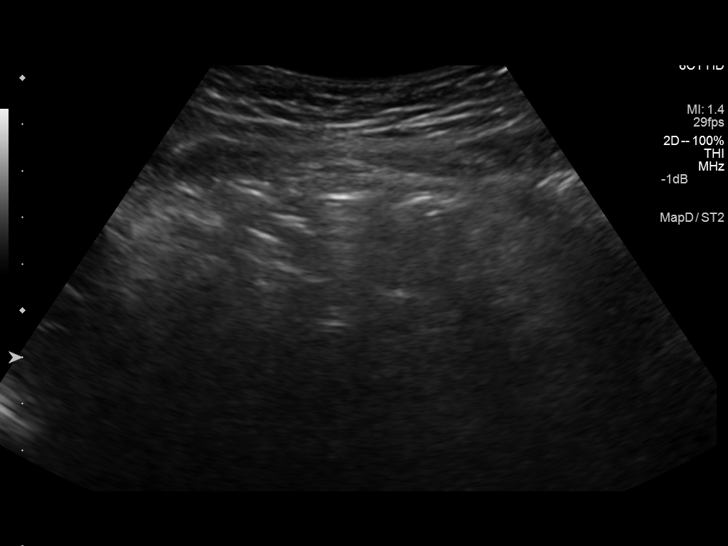
[im 19/38]
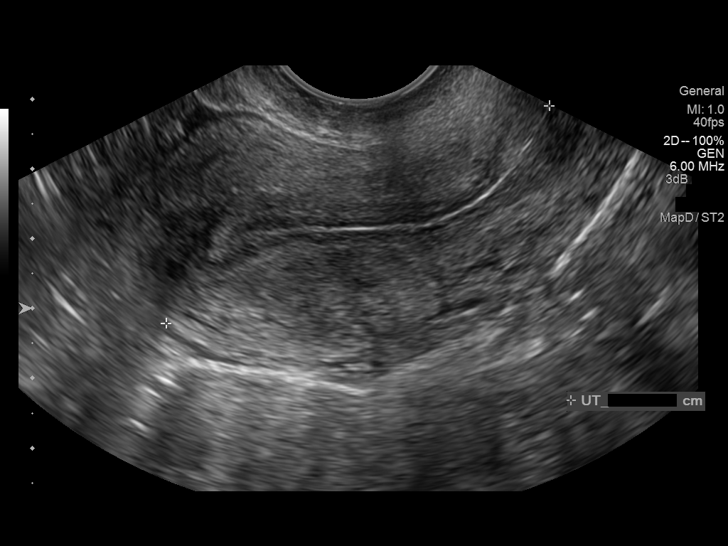
[im 22/38]
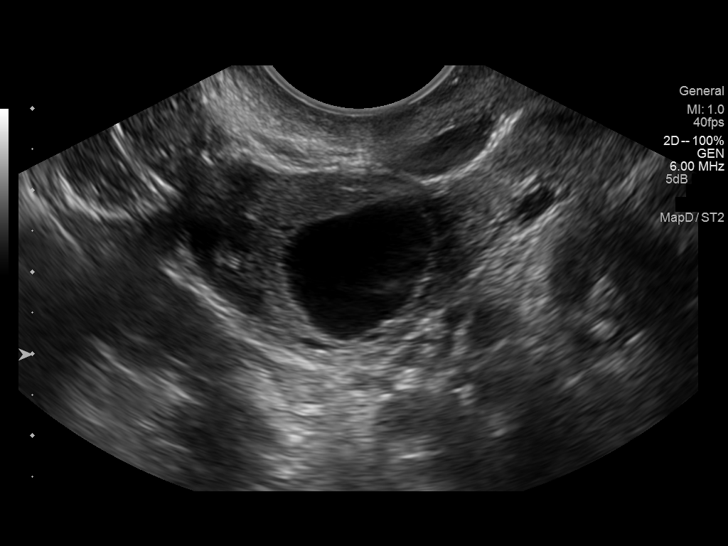
[im 25/38]
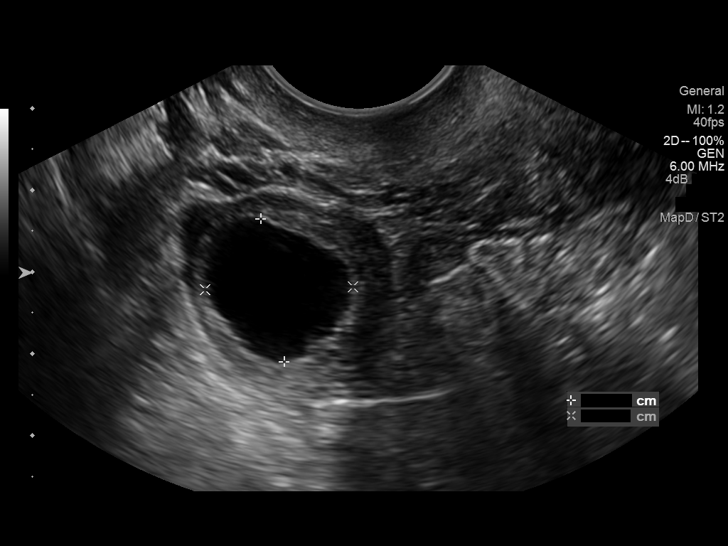
[im 28/38]
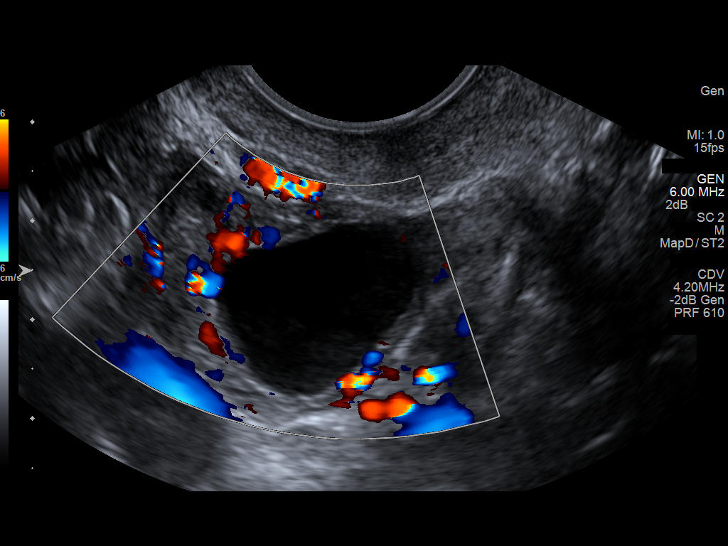
[im 31/38]
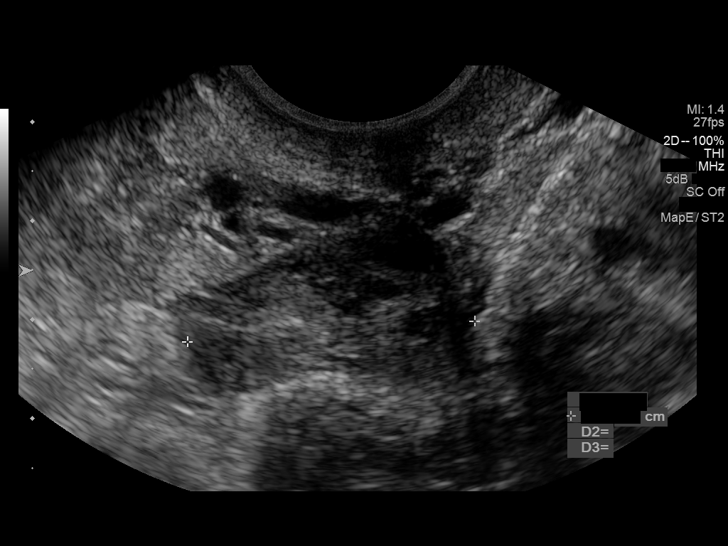
[im 34/38]
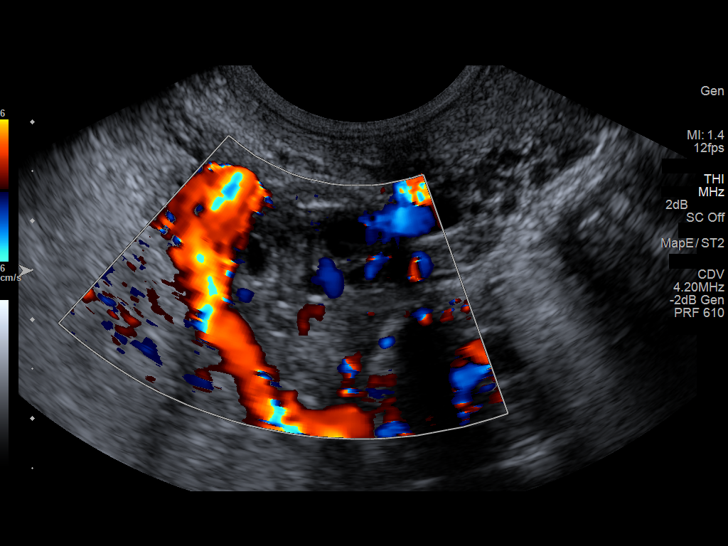
[im 38/38]
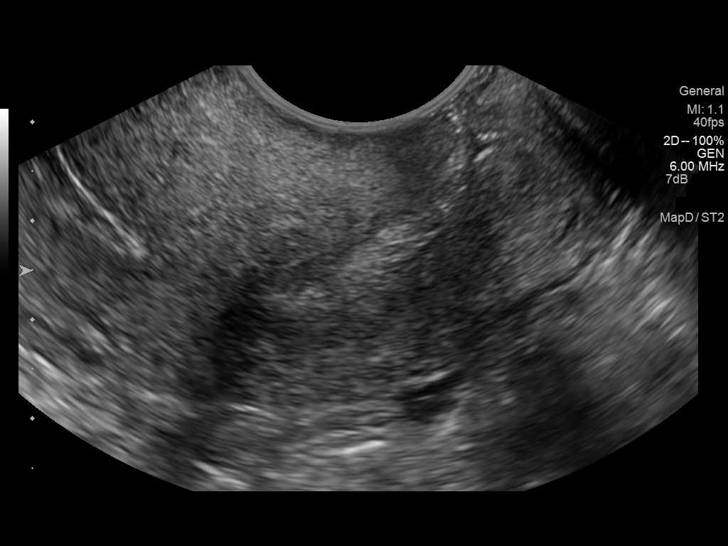

[13 of 25 positions shown; findings below may reference images not displayed]

FINDINGS: Uterus

Measurements: 6.3 x 3.6 x 4.7 cm. No fibroids or other mass
visualized.

Endometrium

Thickness: 6.5 mm.  No focal abnormality visualized.

Right ovary

Measurements: 3.1 x 2.6 x 2.5 cm. There is an anechoic 2.0 x 1.8 x
1.8 cm cyst in the right ovary. Normal blood flow to the ovarian
parenchyma.

Left ovary

Measurements: 2.9 x 2.1 x 1.9 cm. Normal appearance/no adnexal mass.
Normal blood flow seen.

Other findings

Small volume of simple free fluid.
IMPRESSION: 1. Small right ovarian cyst measures 2.0 cm in greatest dimension.
Normal blood flow to the ovarian parenchyma. This is almost
certainly benign, and no specific imaging follow up is recommended
according to the Society of Radiologists in Iltrasound3LQL Consensus
Conference Statement (Winnie Tiger et al. Management of Asymptomatic
Ovarian and Other Adnexal Cysts Imaged at US: Society of
Radiologists in Ultrasound Consensus Conference Statement 5494.
Radiology [DATE]): [id number].).
2. Normal appearance of the uterus and left ovary. Physiologic free
fluid in the pelvis.

## 2016-10-16 ENCOUNTER — Observation Stay
Admission: EM | Admit: 2016-10-16 | Discharge: 2016-10-17 | Disposition: A | Discharge status: Other Acute Inpatient Hospital | Payer: Medicaid Other | Attending: Obstetrics and Gynecology | Admitting: Obstetrics and Gynecology

## 2016-10-16 DIAGNOSIS — O42912 Preterm premature rupture of membranes, unspecified as to length of time between rupture and onset of labor, second trimester: Secondary | ICD-10-CM | POA: Diagnosis not present

## 2016-10-16 DIAGNOSIS — Z9049 Acquired absence of other specified parts of digestive tract: Secondary | ICD-10-CM | POA: Diagnosis not present

## 2016-10-16 DIAGNOSIS — Z3490 Encounter for supervision of normal pregnancy, unspecified, unspecified trimester: Secondary | ICD-10-CM

## 2016-10-16 DIAGNOSIS — O42111 Preterm premature rupture of membranes, onset of labor more than 24 hours following rupture, first trimester: Secondary | ICD-10-CM | POA: Diagnosis present

## 2016-10-16 DIAGNOSIS — Z3A27 27 weeks gestation of pregnancy: Secondary | ICD-10-CM | POA: Insufficient documentation

## 2016-10-16 LAB — CBC
HCT: 28.4 % — ABNORMAL LOW (ref 35.0–47.0)
Hemoglobin: 9.9 g/dL — ABNORMAL LOW (ref 12.0–16.0)
MCH: 30.2 pg (ref 26.0–34.0)
MCHC: 34.9 g/dL (ref 32.0–36.0)
MCV: 86.6 fL (ref 80.0–100.0)
PLATELETS: 187 10*3/uL (ref 150–440)
RBC: 3.29 MIL/uL — AB (ref 3.80–5.20)
RDW: 13.9 % (ref 11.5–14.5)
WBC: 7.4 10*3/uL (ref 3.6–11.0)

## 2016-10-16 LAB — URINALYSIS COMPLETE WITH MICROSCOPIC (ARMC ONLY)
BACTERIA UA: NONE SEEN
BILIRUBIN URINE: NEGATIVE
Glucose, UA: NEGATIVE mg/dL
HGB URINE DIPSTICK: NEGATIVE
LEUKOCYTES UA: NEGATIVE
Nitrite: NEGATIVE
PH: 5 (ref 5.0–8.0)
PROTEIN: NEGATIVE mg/dL
Specific Gravity, Urine: 1.014 (ref 1.005–1.030)

## 2016-10-16 LAB — ROM PLUS (ARMC ONLY): Rom Plus: POSITIVE

## 2016-10-16 MED ORDER — LIDOCAINE HCL (PF) 1 % IJ SOLN: 30.0000 mL | INTRAMUSCULAR | Status: DC | PRN

## 2016-10-16 MED ORDER — MAGNESIUM SULFATE 50 % IJ SOLN
2.0000 g/h | INTRAVENOUS | Status: DC
  Filled 2016-10-16: qty 80

## 2016-10-16 MED ORDER — SODIUM CHLORIDE 0.9 % IV SOLN
2.0000 g | Freq: Four times a day (QID) | INTRAVENOUS | Status: DC
  Administered 2016-10-16: 2 g via INTRAVENOUS
  Filled 2016-10-16: qty 2000

## 2016-10-16 MED ORDER — BETAMETHASONE SOD PHOS & ACET 6 (3-3) MG/ML IJ SUSP
12.0000 mg | Freq: Once | INTRAMUSCULAR | Status: AC
  Administered 2016-10-16: 12 mg via INTRAMUSCULAR
  Filled 2016-10-16: qty 1

## 2016-10-16 MED ORDER — LACTATED RINGERS IV SOLN: 500.0000 mL | INTRAVENOUS | Status: DC | PRN

## 2016-10-16 MED ORDER — ONDANSETRON HCL 4 MG/2ML IJ SOLN
4.0000 mg | Freq: Four times a day (QID) | INTRAMUSCULAR | Status: DC | PRN
Start: 2016-10-16 — End: 2016-10-17

## 2016-10-16 MED ORDER — MAGNESIUM SULFATE BOLUS VIA INFUSION
4.0000 g | Freq: Once | INTRAVENOUS | Status: DC
  Filled 2016-10-16: qty 500

## 2016-10-16 MED ORDER — DEXTROSE 5 % IV SOLN
Freq: Once | INTRAVENOUS | Status: AC
  Administered 2016-10-17: via INTRAVENOUS
  Filled 2016-10-16: qty 500

## 2016-10-16 MED ORDER — LACTATED RINGERS IV SOLN: INTRAVENOUS | Status: DC

## 2016-10-16 MED ORDER — BUTORPHANOL TARTRATE 1 MG/ML IJ SOLN: 1.0000 mg | INTRAMUSCULAR | Status: DC | PRN

## 2016-10-16 NOTE — H&P (Signed)
Jessica Coffey is a G3P2 at 26+6 weeks  25 y.o. female presenting for PPROM since 0400 on 10/16/16. This pregnancy has been complicated by a L/S cholecystectomy( converted to open ) at 19+2 week which resulted in a trochar injury into the amniotic cavity .5 mm injury was closed . Subsequently fluid re-accumulated and she was followed expectantly . At 23 week pt developed RLQ pain developed ecoli bacteremia and [facility] . She did receive a course of steroids at this admission . OB History    Gravida Para Term Preterm AB Living   3 2 2  0 0 2   SAB TAB Ectopic Multiple Live Births   0 0 0 0 2      Obstetric Comments   12/14/15: Induction of labor at 39 weeks for oligohydramnios      Past Medical History:  Diagnosis Date  . Anemia   . Gallstones   . Oligohydramnios   . Sickle cell trait (HCC)   . UTI (lower urinary tract infection)    Past Surgical History:  Procedure Laterality Date  . CHOLECYSTECTOMY N/A 08/24/2016   Procedure: LAPAROSCOPIC CHOLECYSTECTOMY CONVERTED TO OPEN;  Surgeon: Lattie Hawichard E Cooper, MD;  Location: [facility] ORS;  Service: General;  Laterality: N/A;   Family History: family history is not on file. Social History:  reports that she has never smoked. She has never used smokeless tobacco. She reports that she does not drink alcohol or use drugs. + [facility] drug screen 08/22/16 + 09/20/16    Maternal Diabetes: No Genetic Screening: not done Maternal Ultrasounds/Referrals:see records  Fetal Ultrasounds or other Referrals:  See records  Maternal Substance Abuse: + MJ Significant Maternal Medications:  None Significant Maternal Lab Results:  Lab values include: Other: + ROM plus, grossly ruptured  Other Comments:  None  ROS History   Blood pressure 110/63, pulse 92, temperature 98.3 F (36.8 C), temperature source Oral, resp. rate 20, height 5\' 5"  (1.651 m), weight 117 lb (53.1 kg), last menstrual period 03/24/2016, unknown if currently breastfeeding. Exam Physical  Exam  Lungs CTA CV RRR adb soft NT  Pelvic + ROM plus by nursing , grossly ruptured   EFM: 130-140 's Prenatal labs: ABO, Rh: --/--/O POS (09/13 2142) Antibody: NEG (09/13 2142) Rubella: 4.71 (09/13 2142) RPR: Non Reactive (09/13 2142)  HBsAg: Negative (09/13 2142)  HIV:   neg GBS:   Positive on 09/20/16 Varicella Non immune  Assessment/Plan: PPROM at 26+6  At risk for PTD  [facility] ( accepting Dr. Kathrine HaddockAmber Wood) Start Ampicillin 2 gm q 6hr IV + azithromycin 1gm IV  Add Magnesium sulfate for tocolysis and neuro protection  12 mg Betamethasone IM  Discussed with patient and family . They are aware of the necessity for transfer . All questions have been answered .      Murlin Schrieber 10/16/2016, 10:57 PM

## 2016-10-17 DIAGNOSIS — O42912 Preterm premature rupture of membranes, unspecified as to length of time between rupture and onset of labor, second trimester: Secondary | ICD-10-CM | POA: Diagnosis not present

## 2016-10-17 NOTE — OB Triage Note (Signed)
Patient came in for observation for possible SROM around 0400. She has been feeling leaking of fluid since then where she has had to change her pad frequently. Patient denies uterine contractions at this time. Patient reports thick mucous and vaginal discharge once today. Patient states she has been feeling the baby move fine. Vital signs stable and patient afebrile. Family at bedside. Will continue to monitor.

## 2016-10-17 NOTE — Discharge Summary (Signed)
  Pt admitted at 26+6 weeks At 2230 on 10/16/16 . PPROM . Started on ampicillin and zithromycin . Betamethasone  IM given . Magnesium sulfate started .  [facility]  Due to high risk of PTD

## 2016-10-17 NOTE — Progress Notes (Signed)
[facility] and [facility] to [facility]. Will continue to monitor.

## 2016-11-11 IMAGING — US US ABDOMEN COMPLETE
1 series · 14 of 25 positions shown · non-contrast
Comparison: None.

CLINICAL DATA: Acute onset of epigastric pain, 31 weeks pregnant

EXAM:
ULTRASOUND ABDOMEN COMPLETE

[Series 1: us abdomen complete · 0.19mm/px · 14 of 96 slices shown]
[im 1/96]
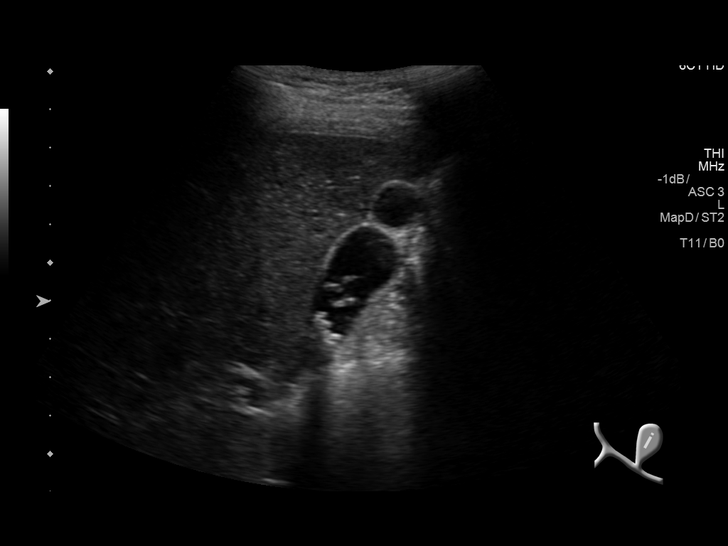
[im 8/96]
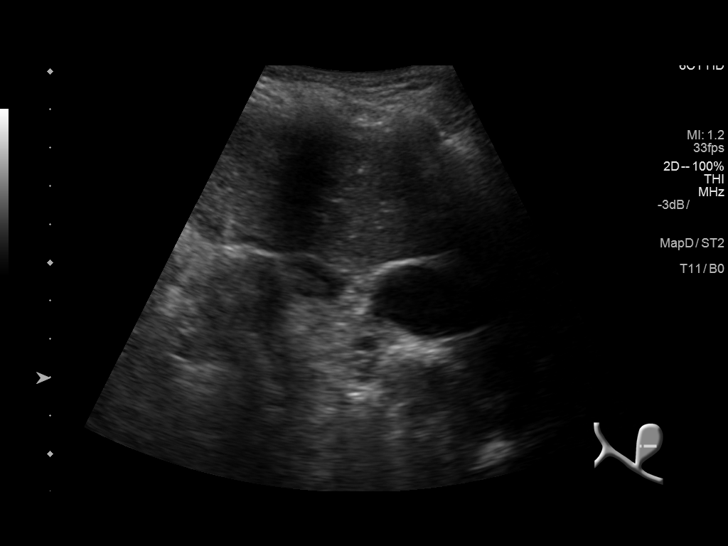
[im 16/96]
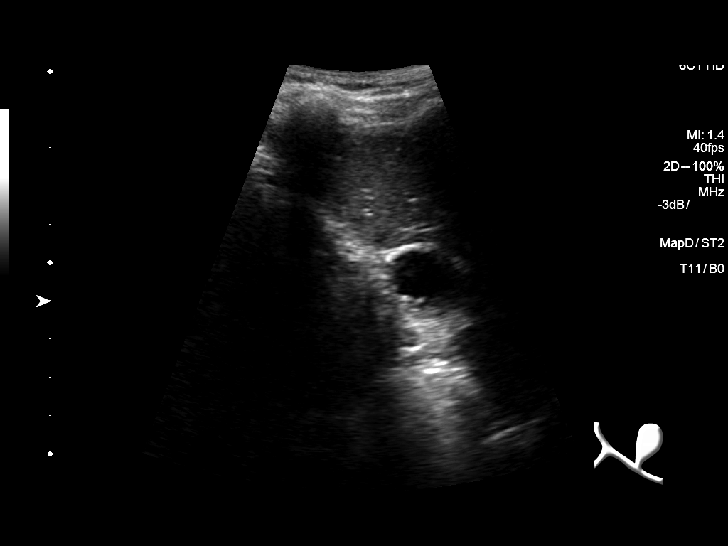
[im 24/96]
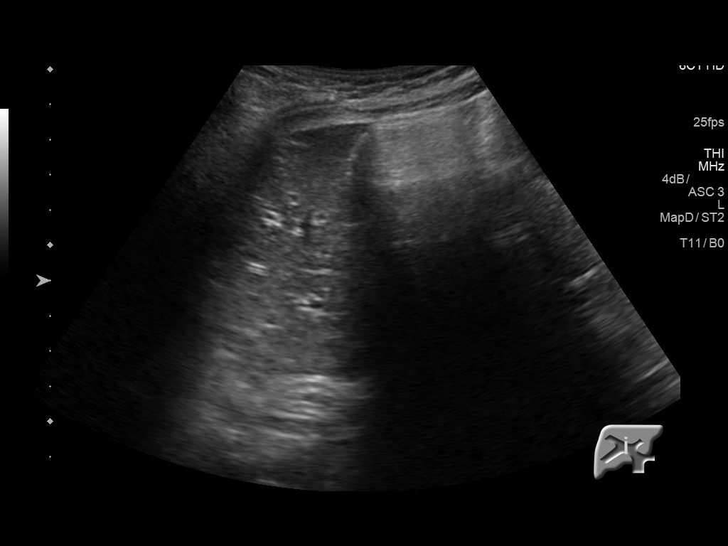
[im 32/96]
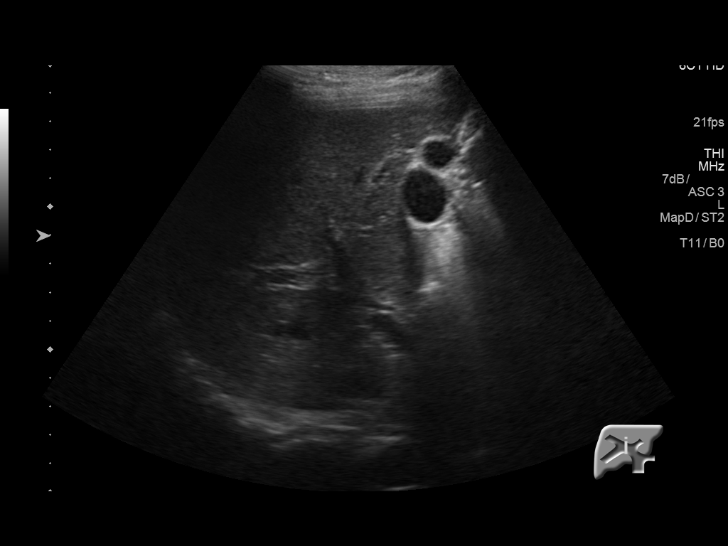
[im 36/96]
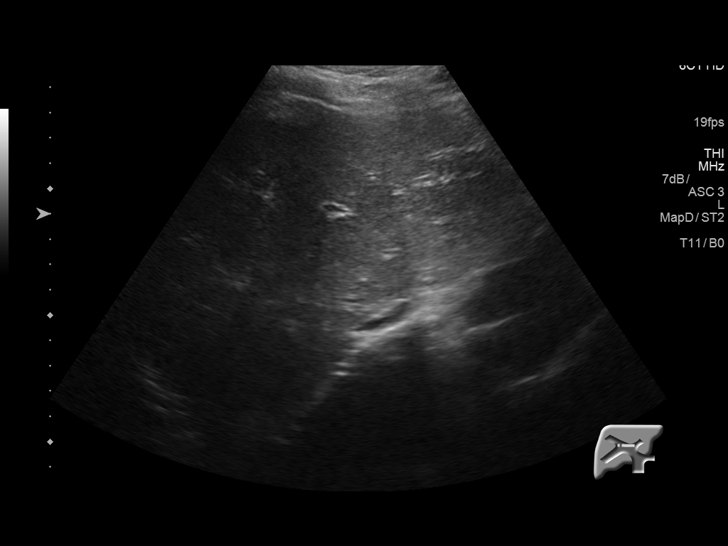
[im 44/96]
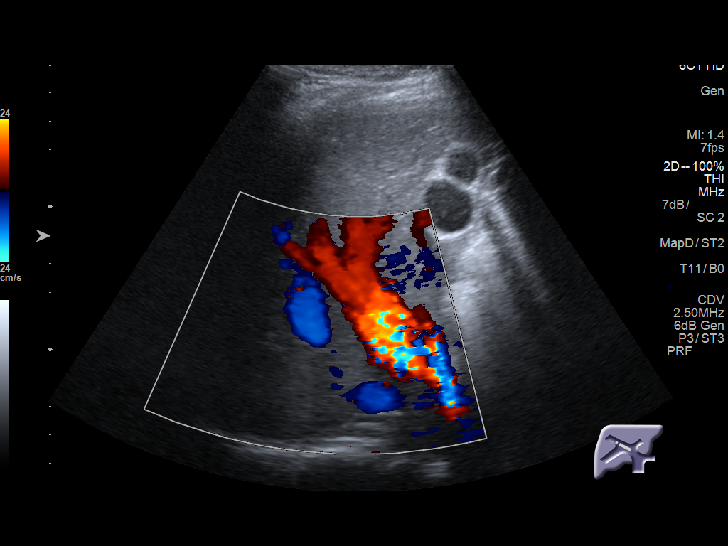
[im 52/96]
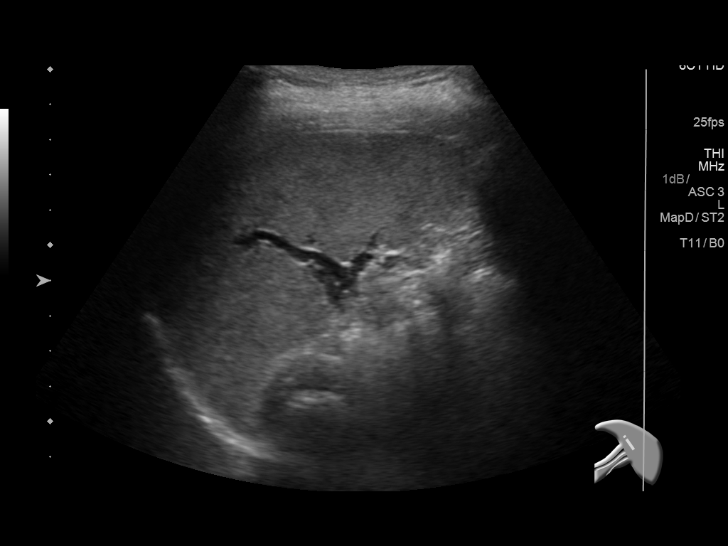
[im 60/96]
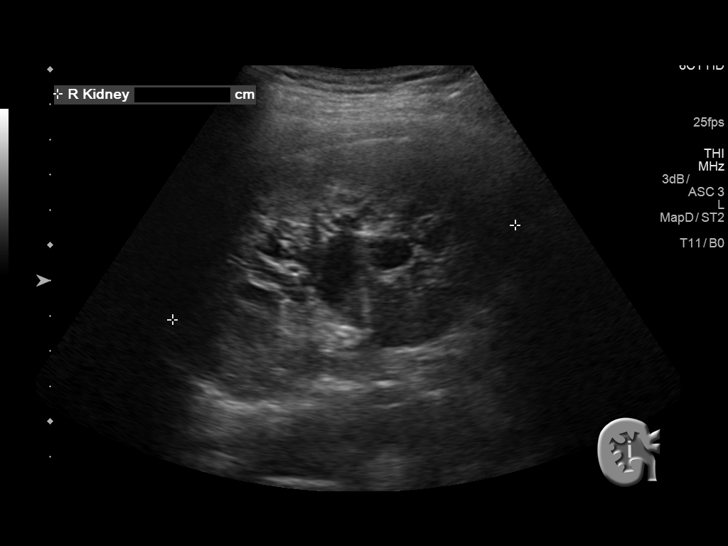
[im 64/96]
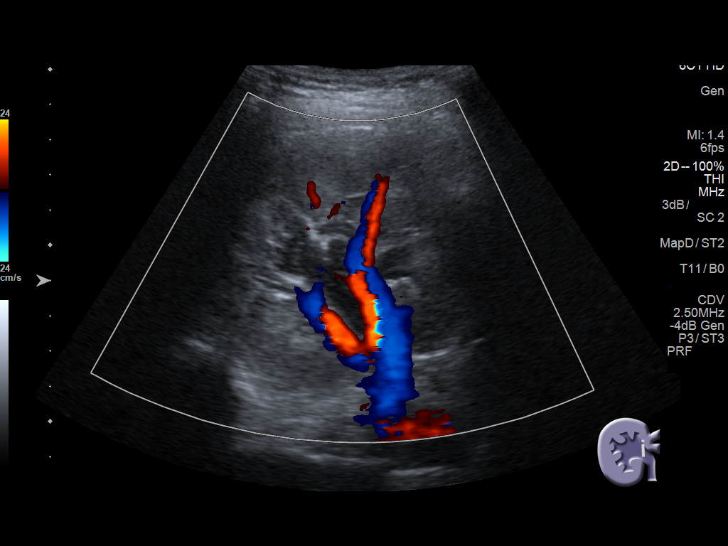
[im 72/96]
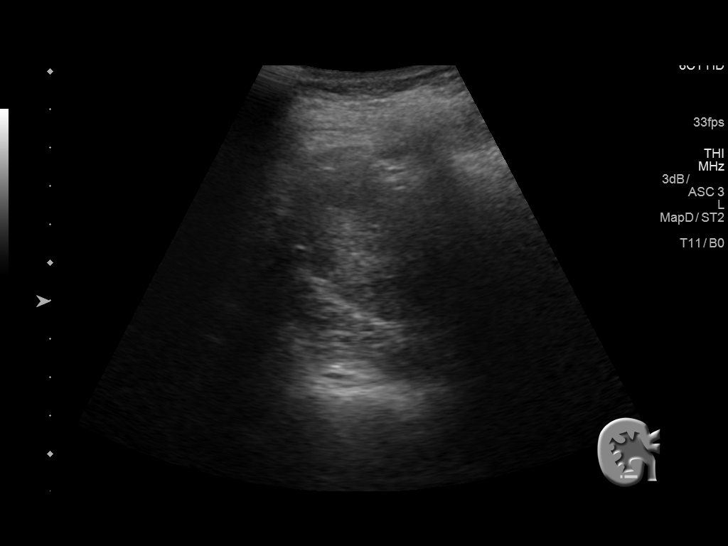
[im 80/96]
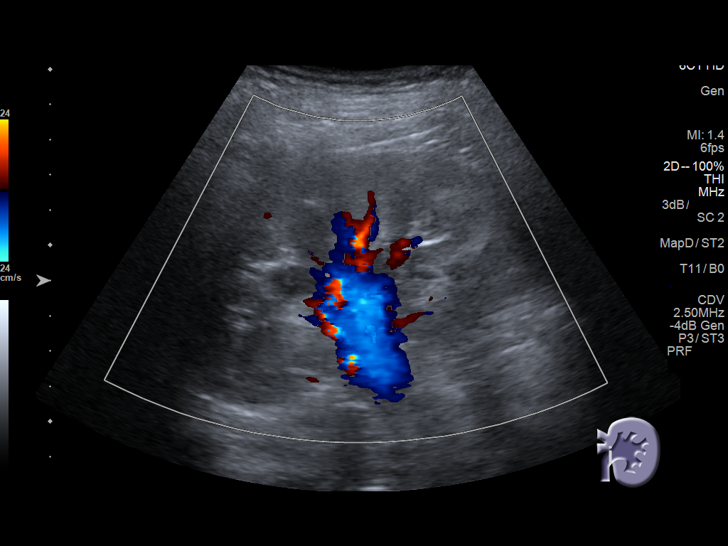
[im 88/96]
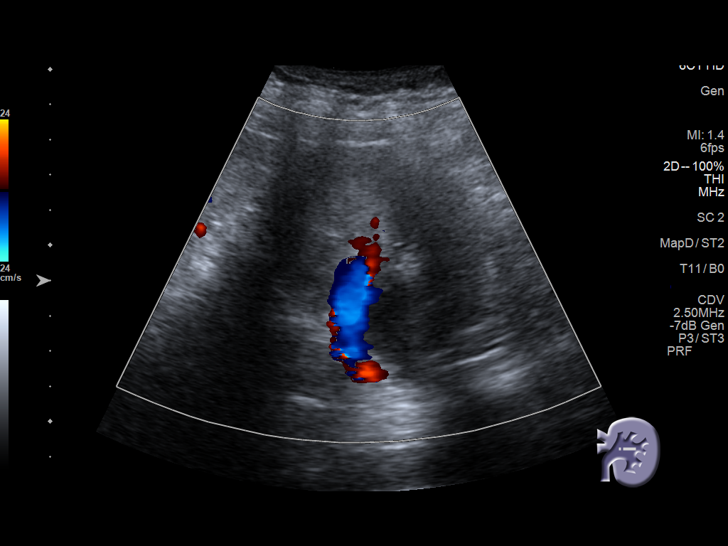
[im 96/96]
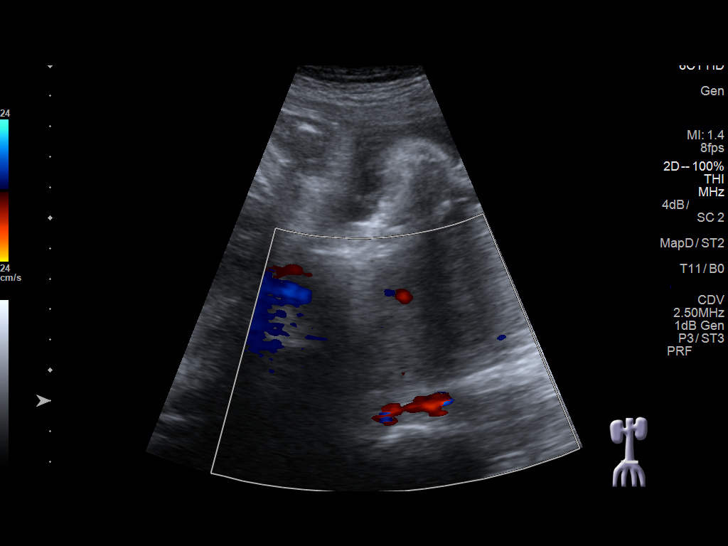

[14 of 25 positions shown; findings below may reference images not displayed]

FINDINGS: Gallbladder: Multiple gallstones are noted within gallbladder the
largest measures 4 mm. No thickening of gallbladder wall. No
sonographic Murphy's sign.

Common bile duct: Diameter: 3 mm in diameter within normal limits.

Liver: No focal lesion identified. Within normal limits in
parenchymal echogenicity.

IVC: No abnormality visualized.

Pancreas: Not visualized

Spleen: Size and appearance within normal limits. Measures 6.3 cm in
length

Right Kidney: Length: 10 cm. Echogenicity within normal limits. Mild
hydronephrosis

Left Kidney: Length: 10.7 cm. Echogenicity within normal limits.
Mild hydronephrosis

Abdominal aorta: No aneurysm visualized. Measures 1.9 cm in
diameter.

Other findings: None.
IMPRESSION: 1. Multiple gallstones are noted within gallbladder the largest
measures 4 mm. No thickening of gallbladder wall. No sonographic
Murphy's sign.
2. Normal CBD.
3. Mild bilateral hydronephrosis.
4. The pancreas is not visualized.
5. No aortic aneurysm.

## 2017-01-02 IMAGING — US US OB TRANSVAGINAL
1 series · 14 of 28 positions shown · non-contrast
Comparison: 04/21/2015

CLINICAL DATA: Right lower quadrant pain 1 day. Quantitative beta
HCG 39,653 (4,728 on 04/21/2015). Estimated gestational age per LMP
5 weeks 4 days.

EXAM:
OBSTETRIC <14 WK US AND TRANSVAGINAL OB US
TECHNIQUE: Both transabdominal and transvaginal ultrasound examinations were
performed for complete evaluation of the gestation as well as the
maternal uterus, adnexal regions, and pelvic cul-de-sac.
Transvaginal technique was performed to assess early pregnancy.

[Series 1: us ob transvaginal · 0.21mm/px · 14 of 183 slices shown]
[im 7/183]
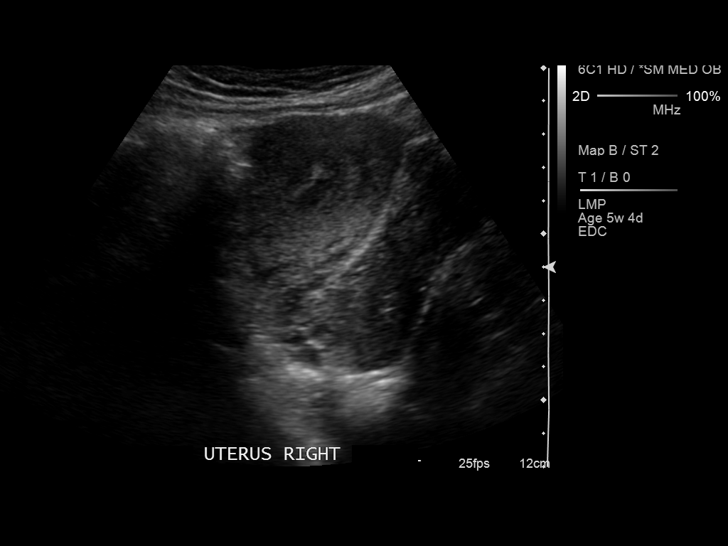
[im 21/183]
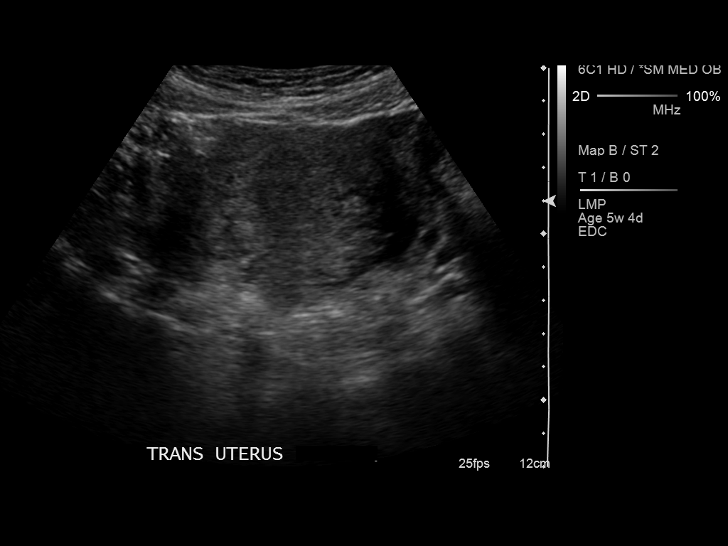
[im 34/183]
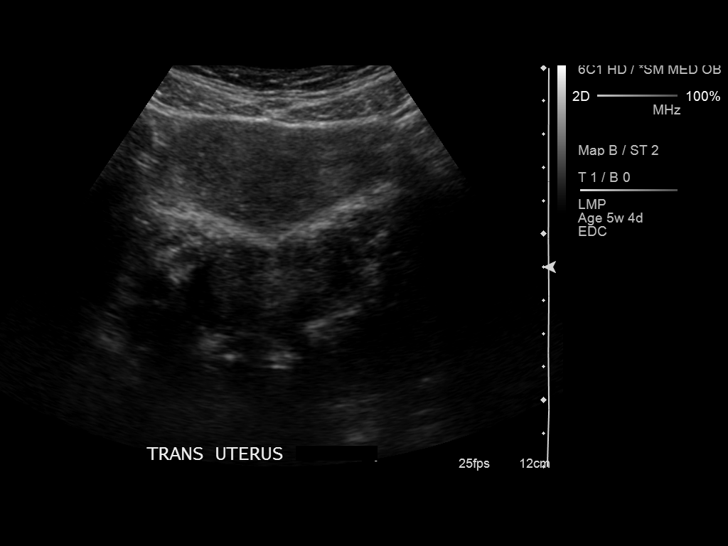
[im 48/183]
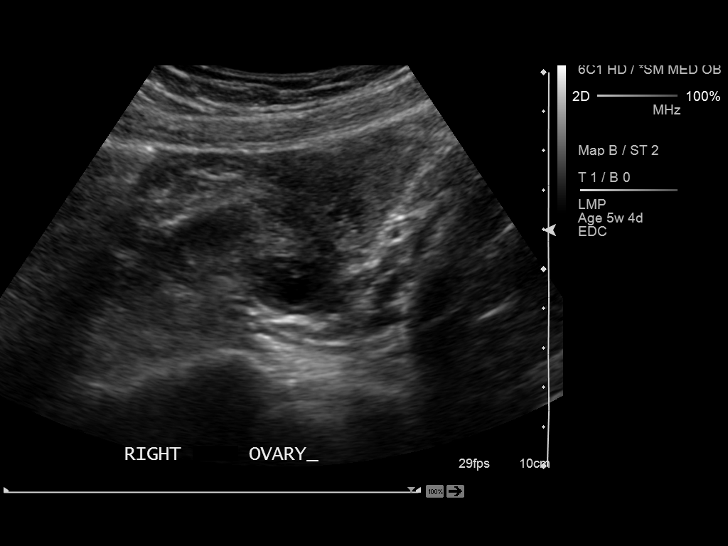
[im 61/183]
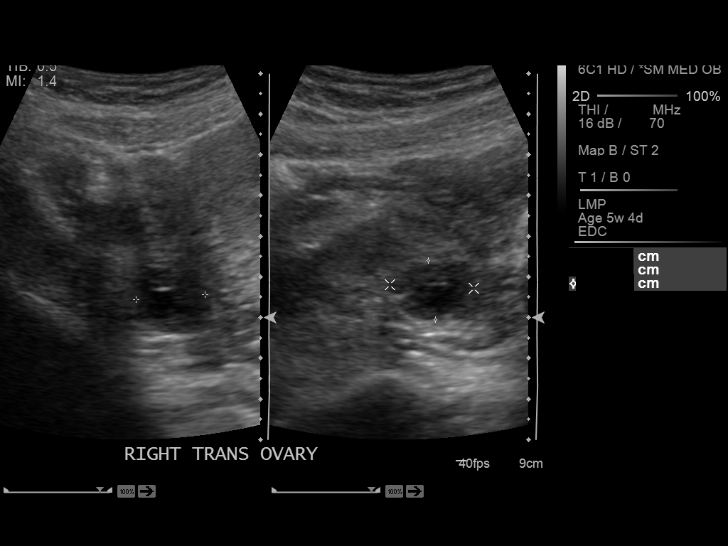
[im 75/183]
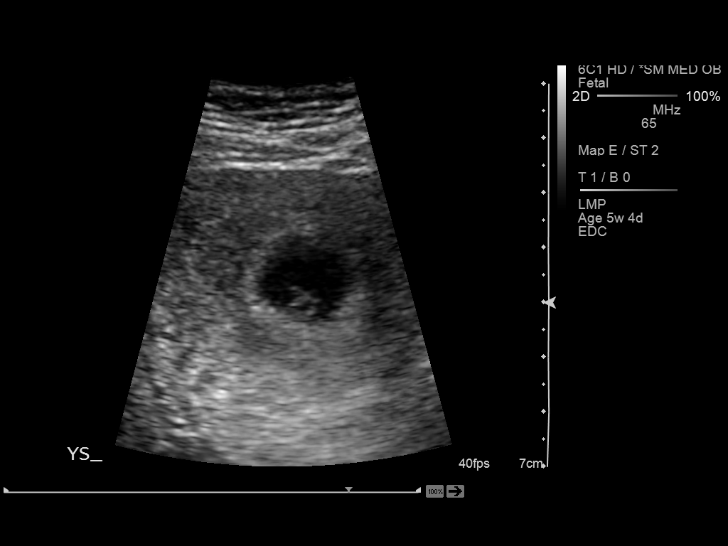
[im 88/183]
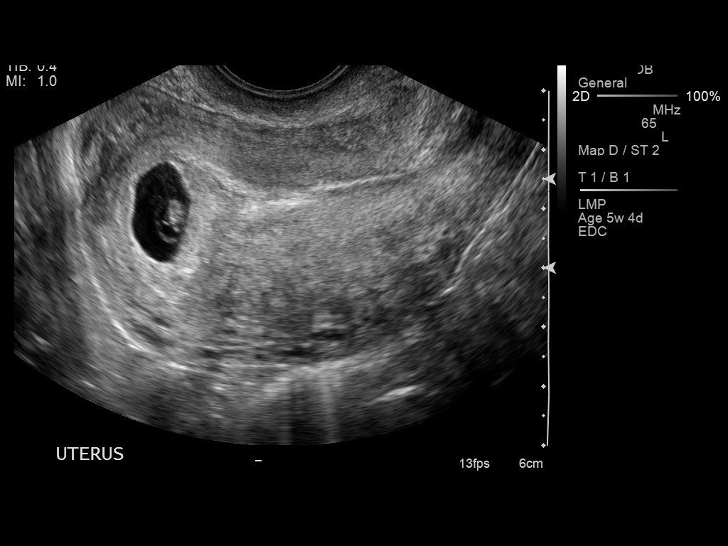
[im 102/183]
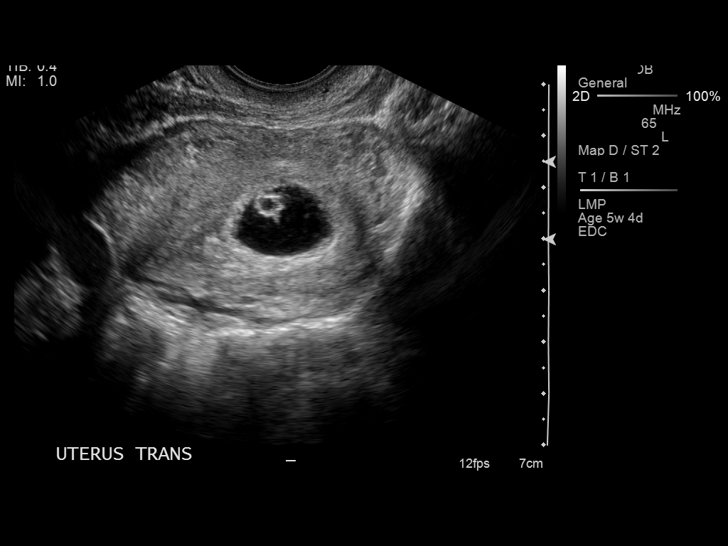
[im 115/183]
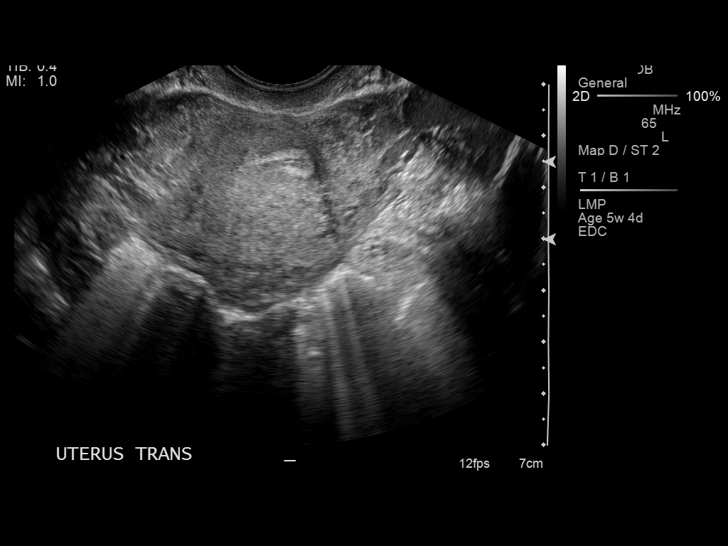
[im 129/183]
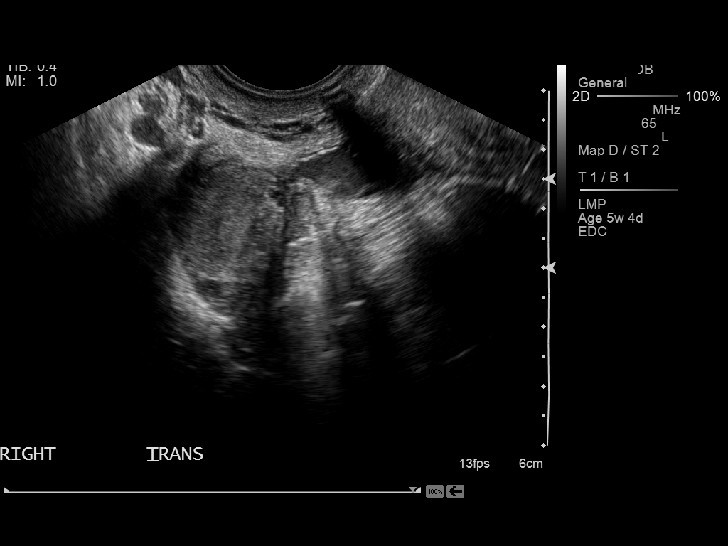
[im 142/183]
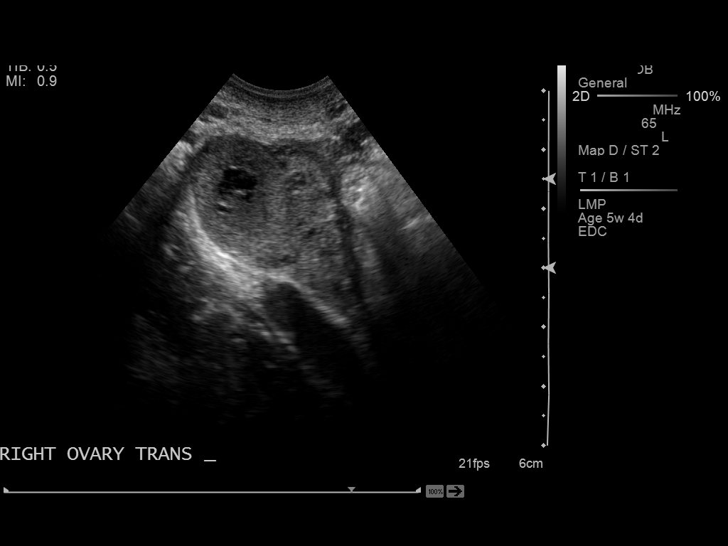
[im 156/183]
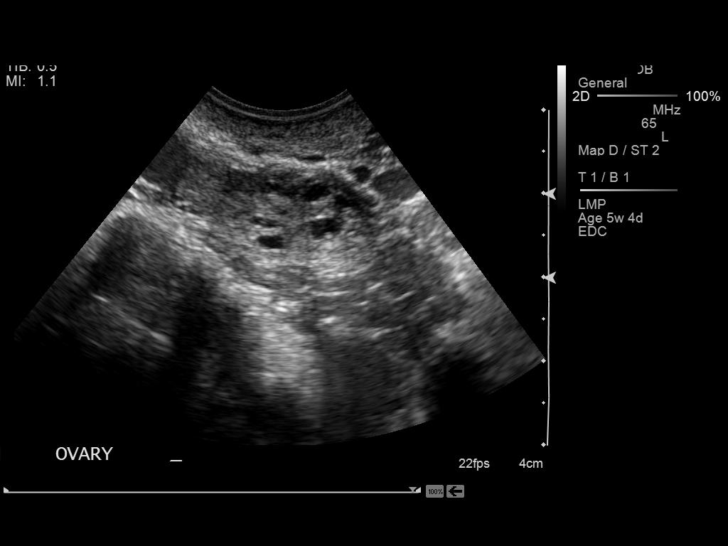
[im 169/183]
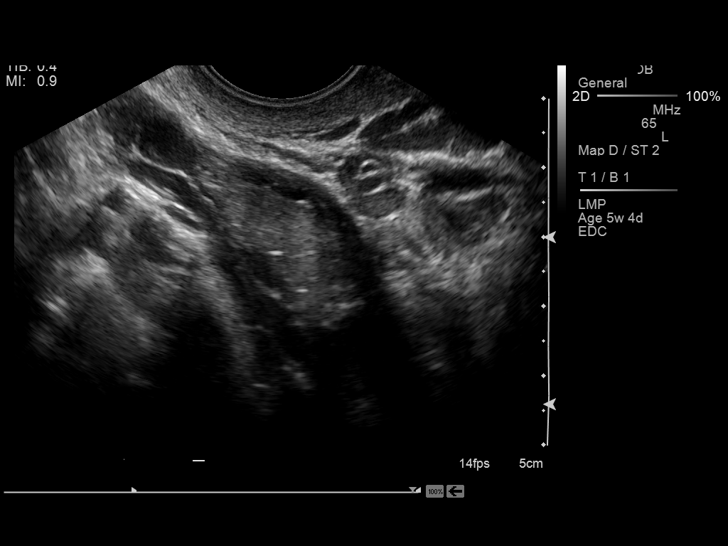
[im 183/183]
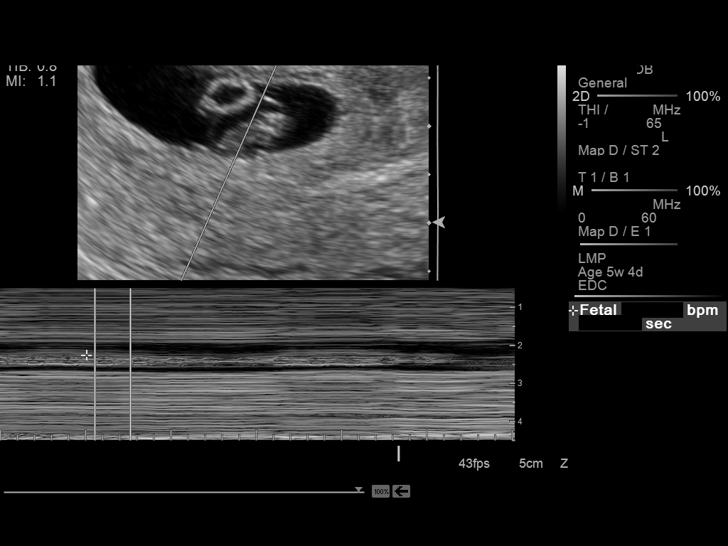

[14 of 28 positions shown; findings below may reference images not displayed]

FINDINGS: Intrauterine gestational sac: Visualized/normal in shape.

Yolk sac:  Visualized.

Embryo:  Visualized.

Cardiac Activity: Visualized.

Heart Rate: 144  bpm

CRL:  8.5  mm   6 w   6 d                  US EDC: 12/21/2015

No evidence of subchorionic hemorrhage.

Maternal uterus/adnexae: Normal left ovary. Probable corpus luteum
over the right ovary measuring 2.3 cm. Normal color flow to the
ovaries bilaterally. No free pelvic fluid.
IMPRESSION: Single live IUP with estimated gestational age 6 weeks 6 days and
EDC 12/21/2015.

## 2017-06-27 ENCOUNTER — Encounter (HOSPITAL_COMMUNITY): Payer: Self-pay

## 2017-07-27 ENCOUNTER — Emergency Department
Admission: EM | Admit: 2017-07-27 | Discharge: 2017-07-27 | Disposition: A | Discharge status: Home / Self Care | Payer: Medicaid Other | Attending: Emergency Medicine | Admitting: Emergency Medicine

## 2017-07-27 ENCOUNTER — Encounter: Payer: Self-pay | Admitting: Emergency Medicine

## 2017-07-27 DIAGNOSIS — F17210 Nicotine dependence, cigarettes, uncomplicated: Secondary | ICD-10-CM | POA: Diagnosis not present

## 2017-07-27 DIAGNOSIS — K2900 Acute gastritis without bleeding: Secondary | ICD-10-CM | POA: Diagnosis not present

## 2017-07-27 DIAGNOSIS — R109 Unspecified abdominal pain: Secondary | ICD-10-CM | POA: Diagnosis present

## 2017-07-27 LAB — CBC
HCT: 38.1 % (ref 35.0–47.0)
Hemoglobin: 13.1 g/dL (ref 12.0–16.0)
MCH: 29 pg (ref 26.0–34.0)
MCHC: 34.3 g/dL (ref 32.0–36.0)
MCV: 84.6 fL (ref 80.0–100.0)
Platelets: 195 10*3/uL (ref 150–440)
RBC: 4.5 MIL/uL (ref 3.80–5.20)
RDW: 13.3 % (ref 11.5–14.5)
WBC: 5.7 10*3/uL (ref 3.6–11.0)

## 2017-07-27 LAB — COMPREHENSIVE METABOLIC PANEL
ALT: 19 U/L (ref 14–54)
AST: 19 U/L (ref 15–41)
Albumin: 4.7 g/dL (ref 3.5–5.0)
Alkaline Phosphatase: 35 U/L — ABNORMAL LOW (ref 38–126)
Anion gap: 7 (ref 5–15)
BUN: 9 mg/dL (ref 6–20)
CHLORIDE: 106 mmol/L (ref 101–111)
CO2: 27 mmol/L (ref 22–32)
CREATININE: 0.63 mg/dL (ref 0.44–1.00)
Calcium: 9.3 mg/dL (ref 8.9–10.3)
Glucose, Bld: 83 mg/dL (ref 65–99)
POTASSIUM: 3.2 mmol/L — AB (ref 3.5–5.1)
Sodium: 140 mmol/L (ref 135–145)
Total Bilirubin: 0.6 mg/dL (ref 0.3–1.2)
Total Protein: 7.7 g/dL (ref 6.5–8.1)

## 2017-07-27 LAB — URINALYSIS, COMPLETE (UACMP) WITH MICROSCOPIC
Bilirubin Urine: NEGATIVE
GLUCOSE, UA: NEGATIVE mg/dL
KETONES UR: NEGATIVE mg/dL
LEUKOCYTES UA: NEGATIVE
Nitrite: NEGATIVE
PH: 6 (ref 5.0–8.0)
PROTEIN: NEGATIVE mg/dL
Specific Gravity, Urine: 1.015 (ref 1.005–1.030)

## 2017-07-27 LAB — POCT PREGNANCY, URINE: PREG TEST UR: NEGATIVE

## 2017-07-27 LAB — LIPASE, BLOOD: Lipase: 25 U/L (ref 11–51)

## 2017-07-27 LAB — TROPONIN I: Troponin I: <0.03 ng/mL (ref <0.03)

## 2017-07-27 MED ORDER — PANTOPRAZOLE SODIUM 20 MG PO TBEC: 20.0000 mg | DELAYED_RELEASE_TABLET | Freq: Every day | ORAL | 1 refills | Status: AC

## 2017-07-27 MED ORDER — GI COCKTAIL ~~LOC~~
30.0000 mL | Freq: Once | ORAL | Status: AC
  Administered 2017-07-27: 30 mL via ORAL
  Filled 2017-07-27: qty 30

## 2017-07-27 NOTE — ED Notes (Signed)
Patient verbalizes understanding of discharge instructions, prescriptions, home care and follow up care. Patient out of department at this time. 

## 2017-07-27 NOTE — ED Triage Notes (Signed)
Pt presents via POV c/o epigastric pain radiating into back. Pt denies emesis. + nausea.

## 2017-07-27 NOTE — ED Provider Notes (Signed)
[facility] Emergency Department Provider Note   ____________________________________________    I have reviewed the triage vital signs and the nursing notes.   HISTORY  Chief Complaint Abdominal Pain     HPI Jessica Coffey is a 26 y.o. female who presents with complaints of epigastric abdominal pain. Patient reports this started today, she describes as a burning sensation in her epigastrium. Occasionally it feels like he goes to her back. No nausea or vomiting. She is never had this before. No fevers or chills. She is tolerating by mouth's. She has not taken anything for this.   Past Medical History:  Diagnosis Date  . Anemia   . Gallstones   . Oligohydramnios   . Sickle cell trait (HCC)   . UTI (lower urinary tract infection)     Patient Active Problem List   Diagnosis Date Noted  . Pregnancy 10/16/2016  . Preterm premature rupture of membranes (PPROM) with onset of labor after 24 hours of rupture in first trimester, antepartum 10/16/2016  . Uterus injury with open wound into cavity 08/26/2016  . Cholecystolithiasis affecting pregnancy, antepartum 08/25/2016  . Cholelithiasis affecting pregnancy in second trimester, antepartum 08/23/2016  . Nausea and vomiting in pregnancy   . Back pain affecting pregnancy in third trimester 12/12/2015  . Supervision of normal pregnancy in third trimester 12/08/2015  . Benign gestational thrombocytopenia in third trimester (HCC) 11/20/2015  . Abdominal pain in pregnancy 11/06/2015  . Abdominal pain affecting pregnancy, antepartum 10/18/2015  . Abdominal pain affecting pregnancy 09/12/2015    Past Surgical History:  Procedure Laterality Date  . CHOLECYSTECTOMY N/A 08/24/2016   Procedure: LAPAROSCOPIC CHOLECYSTECTOMY CONVERTED TO OPEN;  Surgeon: Lattie Haw, MD;  Location: [facility] ORS;  Service: General;  Laterality: N/A;    Prior to Admission medications   Medication Sig Start Date End Date Taking?  Authorizing Provider  acetaminophen (TYLENOL) 500 MG tablet Take 1,000 mg by mouth every 6 (six) hours as needed for mild pain or moderate pain.    [provider]  docusate sodium (COLACE) 100 MG capsule Take 1 capsule (100 mg total) by mouth 2 (two) times daily as needed for mild constipation. Patient not taking: Reported on 09/20/2016 11/20/15   [place] Bing, MD  ferrous gluconate (FERGON) 324 MG tablet Take 1 tablet (324 mg total) by mouth 2 (two) times daily with a meal. Patient not taking: Reported on 09/20/2016 11/20/15   [place] Bing, MD  HYDROcodone-acetaminophen (NORCO/VICODIN) 5-325 MG tablet Take 1-2 tablets by mouth every 6 (six) hours as needed for moderate pain or severe pain. Patient not taking: Reported on 09/20/2016 08/28/16   Christeen Douglas, MD  pantoprazole (PROTONIX) 20 MG tablet Take 1 tablet (20 mg total) by mouth daily. 07/27/17 07/27/18  Jene Every, MD  Prenatal Vit-Fe Fumarate-FA (PRENATAL MULTIVITAMIN) TABS tablet Take 1 tablet by mouth daily at 12 noon.    [provider]     Allergies Patient has no known allergies.  No family history on file.  Social History Social History  Substance Use Topics  . Smoking status: Light Tobacco Smoker    Types: Cigarettes  . Smokeless tobacco: Never Used  . Alcohol use No    Review of Systems  Constitutional: No fever/chills Eyes: No visual changes.  ENT: No sore throat. Cardiovascular: Denies chest pain. Respiratory: Denies shortness of breath. Gastrointestinal:As above   Genitourinary: Negative for dysuria. Musculoskeletal: Negative for back pain. Skin: Negative for rash. Neurological: Negative for headaches  ____________________________________________   PHYSICAL EXAM:  VITAL SIGNS: ED Triage Vitals  Enc Vitals Group     BP 07/27/17 1727 117/73     Pulse Rate 07/27/17 1727 (!) 110     Resp 07/27/17 2050 16     Temp 07/27/17 1727 99.3 F (37.4 C)     Temp Source  07/27/17 1727 Oral     SpO2 07/27/17 1727 100 %     Weight 07/27/17 1728 56.2 kg (124 lb)     Height 07/27/17 1728 1.651 m (5\' 5" )     Head Circumference --      Peak Flow --      Pain Score 07/27/17 1727 10     Pain Loc --      Pain Edu? --      Excl. in GC? --     Constitutional: Alert and oriented. No acute distress. Pleasant and interactive Eyes: Conjunctivae are normal.  Head: Atraumatic. Nose: No congestion/rhinnorhea. Mouth/Throat: Mucous membranes are moist.    Cardiovascular: Normal rate, regular rhythm. Grossly normal heart sounds.  Good peripheral circulation. Respiratory: Normal respiratory effort.  No retractions. Lungs CTAB. Gastrointestinal: Mild tenderness to palpation of the epigastrium. No distention.  No CVA tenderness. Genitourinary: deferred Musculoskeletal: No lower extremity tenderness nor edema.  Warm and well perfused Neurologic:  Normal speech and language. .  Skin:  Skin is warm, dry and intact. No rash noted. Psychiatric: Mood and affect are normal. Speech and behavior are normal.  ____________________________________________   LABS (all labs ordered are listed, but only abnormal results are displayed)  Labs Reviewed  COMPREHENSIVE METABOLIC PANEL - Abnormal; Notable for the following:       Result Value   Potassium 3.2 (*)    Alkaline Phosphatase 35 (*)    All other components within normal limits  URINALYSIS, COMPLETE (UACMP) WITH MICROSCOPIC - Abnormal; Notable for the following:    Color, Urine YELLOW (*)    APPearance CLEAR (*)    Hgb urine dipstick LARGE (*)    Bacteria, UA MANY (*)    Squamous Epithelial / LPF 0-5 (*)    All other components within normal limits  LIPASE, BLOOD  CBC  TROPONIN I  POCT PREGNANCY, URINE  POC URINE PREG, ED   ____________________________________________  EKG  ED ECG REPORT I, Jene Every, the attending physician, personally viewed and interpreted this ECG.  Date: 07/27/2017  Rate:93 Rhythm:  normal sinus rhythm QRS Axis: normal Intervals: normal ST/T Wave abnormalities: normal Narrative Interpretation: unremarkable  ____________________________________________  RADIOLOGY  None ____________________________________________   PROCEDURES  Procedure(s) performed: No    Critical Care performed:No ____________________________________________   INITIAL IMPRESSION / ASSESSMENT AND PLAN / ED COURSE  Pertinent labs & imaging results that were available during my care of the patient were reviewed by me and considered in my medical decision making (see chart for details).  Patient well-appearing and in no acute distress. History of present illness is most consistent with gastritis versus PUD. She had complete resolution of discomfort with GI cocktail. I will start her on Protonix, she is not breast-feeding. I have asked her to follow-up with her PCP for further evaluation. Return precautions discussed    ____________________________________________   FINAL CLINICAL IMPRESSION(S) / ED DIAGNOSES  Final diagnoses:  Acute gastritis without hemorrhage, unspecified gastritis type      NEW MEDICATIONS STARTED DURING THIS VISIT:  Discharge Medication List as of 07/27/2017  8:01 PM    START taking these medications   Details  pantoprazole (PROTONIX) 20 MG tablet Take 1 tablet (20 mg total) by mouth daily., Starting Sat 07/27/2017, Until Sun 07/27/2018, Print         Note:  This document was prepared using Dragon voice recognition software and may include unintentional dictation errors.    Jene Every, MD 07/27/17 [phone]

## 2017-08-29 ENCOUNTER — Emergency Department
Admission: EM | Admit: 2017-08-29 | Discharge: 2017-08-29 | Disposition: A | Discharge status: Home / Self Care | Payer: Medicaid Other | Attending: Emergency Medicine | Admitting: Emergency Medicine

## 2017-08-29 ENCOUNTER — Encounter: Payer: Self-pay | Admitting: Emergency Medicine

## 2017-08-29 DIAGNOSIS — J069 Acute upper respiratory infection, unspecified: Secondary | ICD-10-CM | POA: Diagnosis not present

## 2017-08-29 DIAGNOSIS — R05 Cough: Secondary | ICD-10-CM | POA: Diagnosis not present

## 2017-08-29 DIAGNOSIS — J029 Acute pharyngitis, unspecified: Secondary | ICD-10-CM

## 2017-08-29 DIAGNOSIS — R070 Pain in throat: Secondary | ICD-10-CM | POA: Diagnosis present

## 2017-08-29 DIAGNOSIS — F17210 Nicotine dependence, cigarettes, uncomplicated: Secondary | ICD-10-CM | POA: Insufficient documentation

## 2017-08-29 DIAGNOSIS — Z79899 Other long term (current) drug therapy: Secondary | ICD-10-CM | POA: Insufficient documentation

## 2017-08-29 DIAGNOSIS — B9789 Other viral agents as the cause of diseases classified elsewhere: Secondary | ICD-10-CM | POA: Insufficient documentation

## 2017-08-29 LAB — POCT RAPID STREP A: STREPTOCOCCUS, GROUP A SCREEN (DIRECT): NEGATIVE

## 2017-08-29 MED ORDER — ACETAMINOPHEN 325 MG PO TABS: 650.0000 mg | ORAL_TABLET | Freq: Once | ORAL | Status: DC | PRN

## 2017-08-29 NOTE — ED Provider Notes (Signed)
[facility] Emergency Department Provider Note  ____________________________________________   First MD Initiated Contact with Patient 08/29/17 1545     (approximate)  I have reviewed the triage vital signs and the nursing notes.   HISTORY  Chief Complaint Sore Throat and Cough    HPI Jessica Coffey is a 26 y.o. female with medical history as listed below presents by private vehicle for evaluation of upper respiratory symptoms including nasal congestion, runny nose, cough frequently productive of white sputum, and sore throat.  She states that she has had intermittent episodes of feeling hot but no documented fever.  The symptoms have been going on for about a week except for the sore throat which started yesterday.  She denies ear pain, neck pain, neck stiffness, headache, nausea, vomiting, abdominal pain.  She is ambulating without difficulty.  Nothing in particular makes the patient's symptoms better nor worse.    She states that her throat hurts worse when she tries to eat and drink but is still able to do so and is not having any trouble swallowing.  Describes the pain as moderate to severe.  She does not have any dental problems of which she is aware.  She presents today with her infant daughter who also is having viral symptoms.   Past Medical History:  Diagnosis Date  . Anemia   . Gallstones   . Oligohydramnios   . Sickle cell trait (HCC)   . UTI (lower urinary tract infection)     Patient Active Problem List   Diagnosis Date Noted  . Pregnancy 10/16/2016  . Preterm premature rupture of membranes (PPROM) with onset of labor after 24 hours of rupture in first trimester, antepartum 10/16/2016  . Uterus injury with open wound into cavity 08/26/2016  . Cholecystolithiasis affecting pregnancy, antepartum 08/25/2016  . Cholelithiasis affecting pregnancy in second trimester, antepartum 08/23/2016  . Nausea and vomiting in pregnancy   . Back pain  affecting pregnancy in third trimester 12/12/2015  . Supervision of normal pregnancy in third trimester 12/08/2015  . Benign gestational thrombocytopenia in third trimester (HCC) 11/20/2015  . Abdominal pain in pregnancy 11/06/2015  . Abdominal pain affecting pregnancy, antepartum 10/18/2015  . Abdominal pain affecting pregnancy 09/12/2015    Past Surgical History:  Procedure Laterality Date  . CHOLECYSTECTOMY N/A 08/24/2016   Procedure: LAPAROSCOPIC CHOLECYSTECTOMY CONVERTED TO OPEN;  Surgeon: Lattie Haw, MD;  Location: [facility] ORS;  Service: General;  Laterality: N/A;    Prior to Admission medications   Medication Sig Start Date End Date Taking? Authorizing Provider  acetaminophen (TYLENOL) 500 MG tablet Take 1,000 mg by mouth every 6 (six) hours as needed for mild pain or moderate pain.    [provider]  docusate sodium (COLACE) 100 MG capsule Take 1 capsule (100 mg total) by mouth 2 (two) times daily as needed for mild constipation. Patient not taking: Reported on 09/20/2016 11/20/15   [place] Bing, MD  ferrous gluconate (FERGON) 324 MG tablet Take 1 tablet (324 mg total) by mouth 2 (two) times daily with a meal. Patient not taking: Reported on 09/20/2016 11/20/15    Bing, MD  HYDROcodone-acetaminophen (NORCO/VICODIN) 5-325 MG tablet Take 1-2 tablets by mouth every 6 (six) hours as needed for moderate pain or severe pain. Patient not taking: Reported on 09/20/2016 08/28/16   Christeen Douglas, MD  pantoprazole (PROTONIX) 20 MG tablet Take 1 tablet (20 mg total) by mouth daily. 07/27/17 07/27/18  Jene Every, MD  Prenatal Vit-Fe Fumarate-FA (PRENATAL MULTIVITAMIN)  TABS tablet Take 1 tablet by mouth daily at 12 noon.    [provider]    Allergies Patient has no known allergies.  No family history on file.  Social History Social History  Substance Use Topics  . Smoking status: Light Tobacco Smoker    Packs/day: 0.25    Types: Cigarettes  .  Smokeless tobacco: Never Used  . Alcohol use No    Review of Systems Constitutional: subjective fever Eyes: No visual changes. ENT: +sore throat, nasal congestion, runny nose Cardiovascular: Denies chest pain. Respiratory: Denies shortness of breath. frequent cough occasionally productive of clear/white sputum Gastrointestinal: No abdominal pain.  No nausea, no vomiting.  No diarrhea.  No constipation. Genitourinary: Negative for dysuria. Musculoskeletal: Negative for neck pain.  Negative for back pain. Integumentary: Negative for rash. Neurological: Negative for headaches, focal weakness or numbness.   ____________________________________________   PHYSICAL EXAM:  VITAL SIGNS: ED Triage Vitals  Enc Vitals Group     BP 08/29/17 1503 (!) 104/51     Pulse Rate 08/29/17 1503 78     Resp 08/29/17 1503 18     Temp 08/29/17 1503 99.2 F (37.3 C)     Temp Source 08/29/17 1503 Oral     SpO2 08/29/17 1503 98 %     Weight 08/29/17 1505 54.4 kg (120 lb)     Height 08/29/17 1505 1.651 m ( )     Head Circumference --      Peak Flow --      Pain Score 08/29/17 1503 10     Pain Loc --      Pain Edu? --      Excl. in GC? --     Constitutional: Alert and oriented. Well appearing and in no acute distress  Eyes: Conjunctivae are normal.  Head: Atraumatic. Ears:  Healthy appearing ear canals and TMs bilaterally Nose: +congestion/rhinnorhea.  the patient sounds "stuffy" when she talks Mouth/Throat: Mucous membranes are moist.  Oropharynx non-erythematous, no petechiae on the oropharynx, no exudate, no sign of peritonsillar or retropharyngeal abscess.  No tenderness to external palpation/manipulation of the larynx Neck: No stridor.  No meningeal signs.  some bilateral submandibular lymphadenopathy that is slightly tender Cardiovascular: Normal rate, regular rhythm. Good peripheral circulation. Grossly normal heart sounds. Respiratory: Normal respiratory effort.  No retractions. Lungs  CTAB. frequent cough. Gastrointestinal: Soft and nontender. No distention.  Musculoskeletal: No lower extremity tenderness nor edema. No gross deformities of extremities. Neurologic:  Normal speech and language. No gross focal neurologic deficits are appreciated.  Skin:  Skin is warm, dry and intact. No rash noted. Psychiatric: Mood and affect are normal. Speech and behavior are normal.  ____________________________________________   LABS (all labs ordered are listed, but only abnormal results are displayed)  Labs Reviewed  POCT RAPID STREP A   ____________________________________________  EKG  None - EKG not ordered by ED physician ____________________________________________  RADIOLOGY   No results found.  ____________________________________________   PROCEDURES  Critical Care performed: No   Procedure(s) performed:   Procedures   ____________________________________________   INITIAL IMPRESSION / ASSESSMENT AND PLAN / ED COURSE  Pertinent labs & imaging results that were available during my care of the patient were reviewed by me and considered in my medical decision making (see chart for details).  the patient has signs and symptoms are consistent with a viral illness.  Her vital signs are within normal limits and her chest is clear to auscultation in spite of a frequent cough.  She has no evidence of any acute bacterial infection of her throat and although the differential diagnosis includes but is not limited to peritonsillar abscess, retropharyngeal abscess, Ludwig's angina, and epiglottitis, I think viral pharyngitis is most likely.  I explained to the patient that there is nothing particular to give that will resolve the symptoms and she needs to improve with time.  I encouraged her to continue drinking plenty of fluids and to use over-the-counter medications such as ibuprofen and Tylenol.  She understands and agrees with the plan.  I gave my usual and  customary return precautions.         ____________________________________________  FINAL CLINICAL IMPRESSION(S) / ED DIAGNOSES  Final diagnoses:  Sore throat  Viral URI with cough     MEDICATIONS GIVEN DURING THIS VISIT:  Medications  acetaminophen (TYLENOL) tablet 650 mg (not administered)     NEW OUTPATIENT MEDICATIONS STARTED DURING THIS VISIT:  New Prescriptions   No medications on file    Modified Medications   No medications on file    Discontinued Medications   No medications on file     Note:  This document was prepared using Dragon voice recognition software and may include unintentional dictation errors.    Loleta Rose, MD 08/29/17 [phone]

## 2017-08-29 NOTE — Discharge Instructions (Signed)

## 2017-08-29 NOTE — ED Notes (Signed)
Pt c/o sore throat and fever since yesterday. Pt ambulatory, NAD noted

## 2017-08-29 NOTE — ED Triage Notes (Signed)
Patient presents to the ED with sore throat that began yesterday, dry cough and low grade fever.  Patient states it is painful to cough and to swallow.  Patient maintaining secretions well at this time.  Patient is in no obvious distress at this time.

## 2017-09-26 IMAGING — US US ABDOMEN LIMITED
1 series · 14 of 25 positions shown · non-contrast
Comparison: Abdominal ultrasound August 23, 2016

CLINICAL DATA: Status post cholecystectomy 3 weeks ago now with
right upper quadrant pain. The patient is 23 weeks pregnant.

EXAM:
US ABDOMEN LIMITED - RIGHT UPPER QUADRANT

[Series 1: us abdomen limited · 0.25mm/px · 14 of 45 slices shown]
[im 1/45]
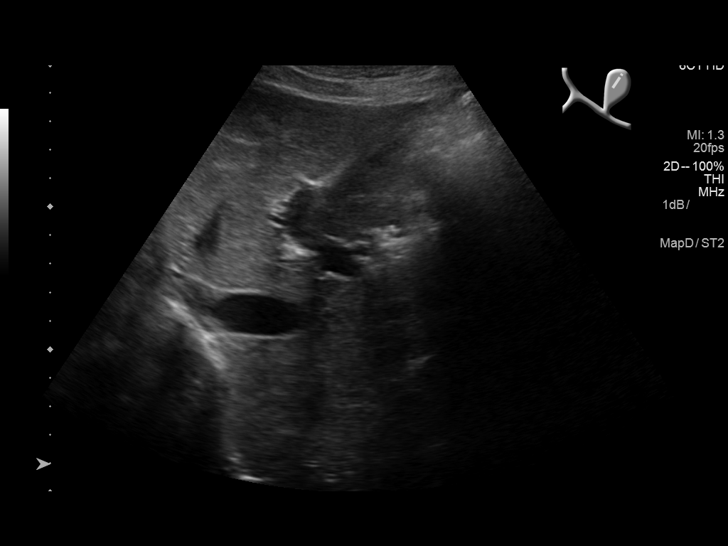
[im 4/45]
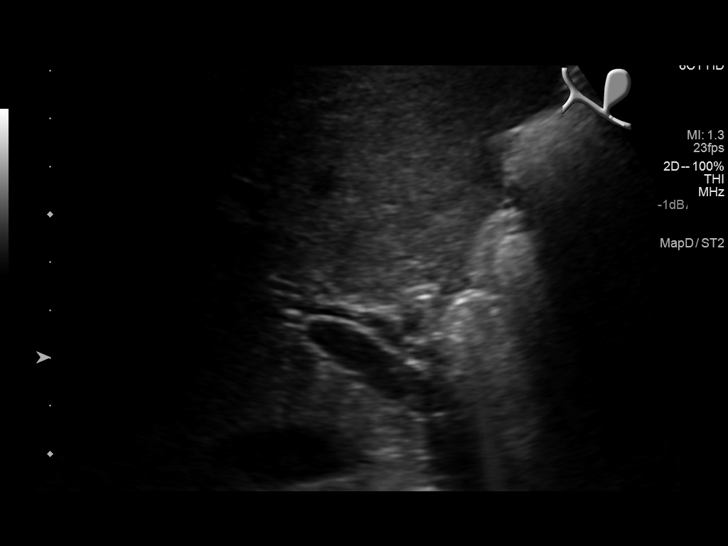
[im 8/45]
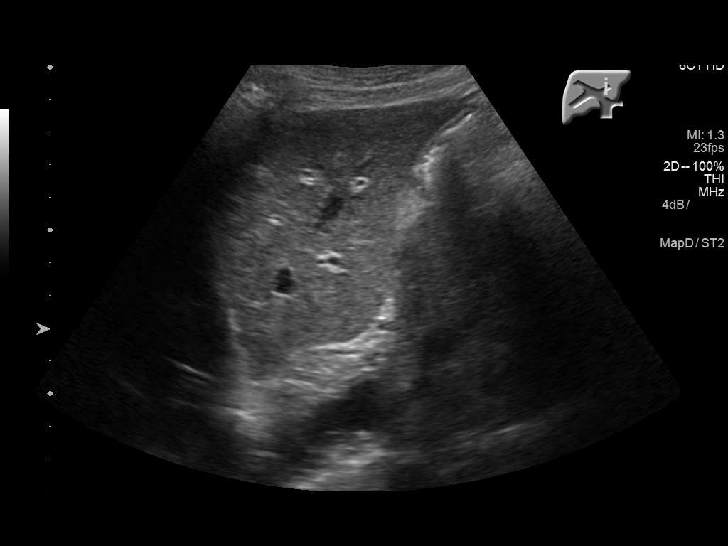
[im 12/45]
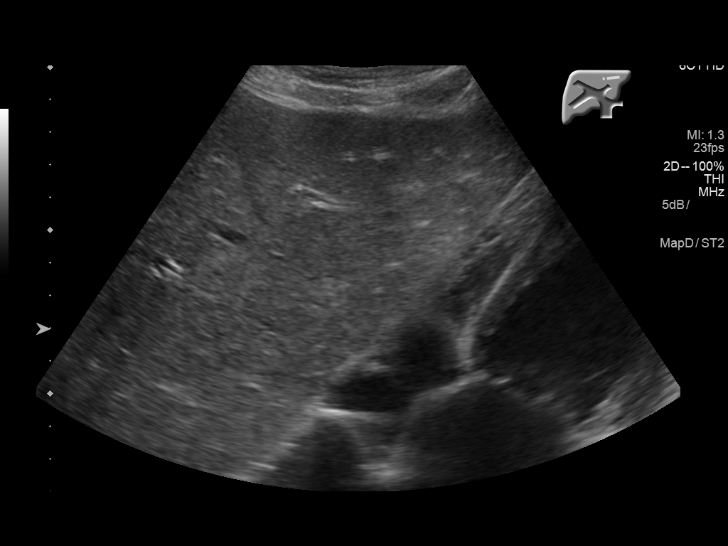
[im 15/45]
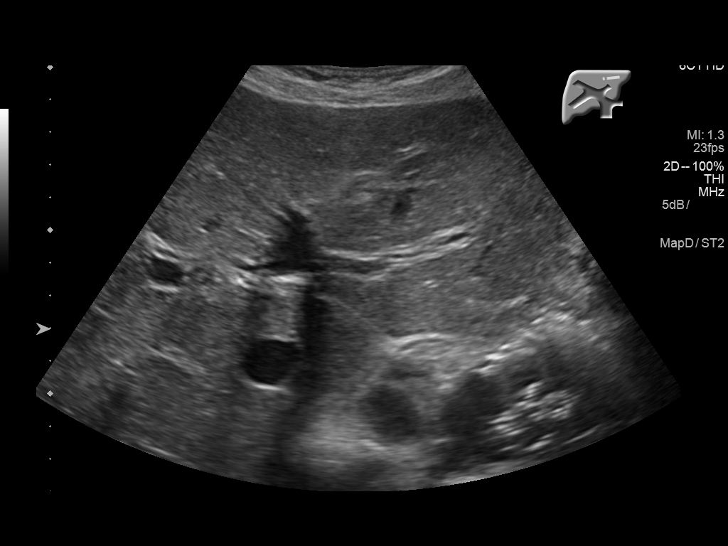
[im 17/45]
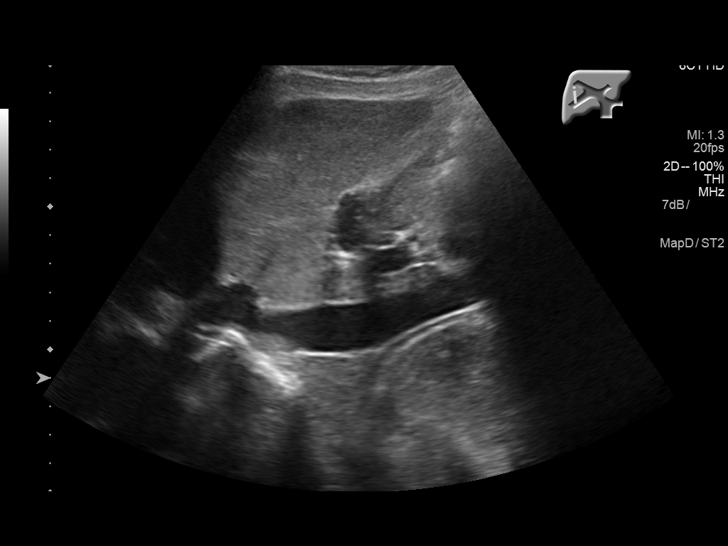
[im 21/45]
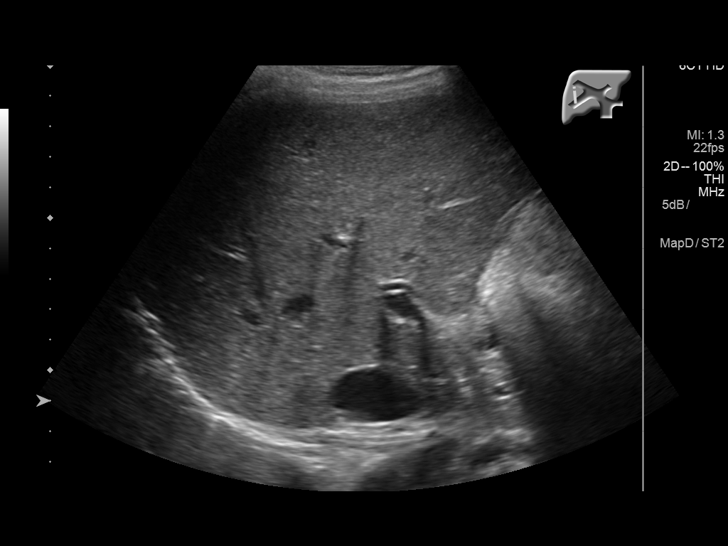
[im 24/45]
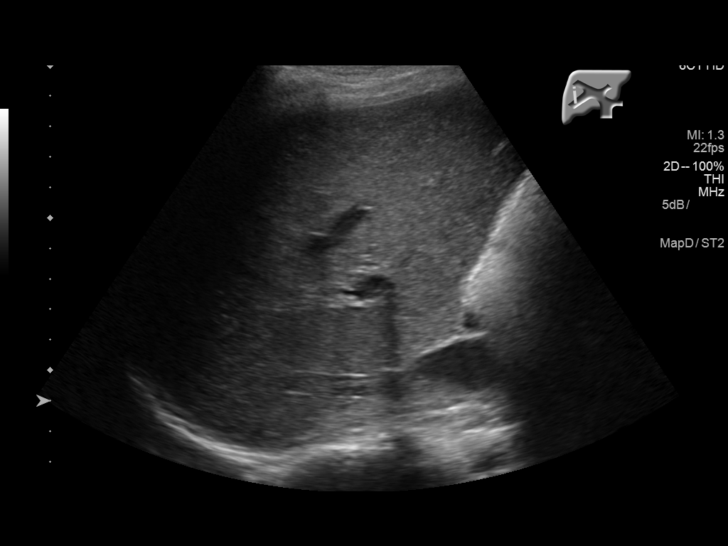
[im 28/45]
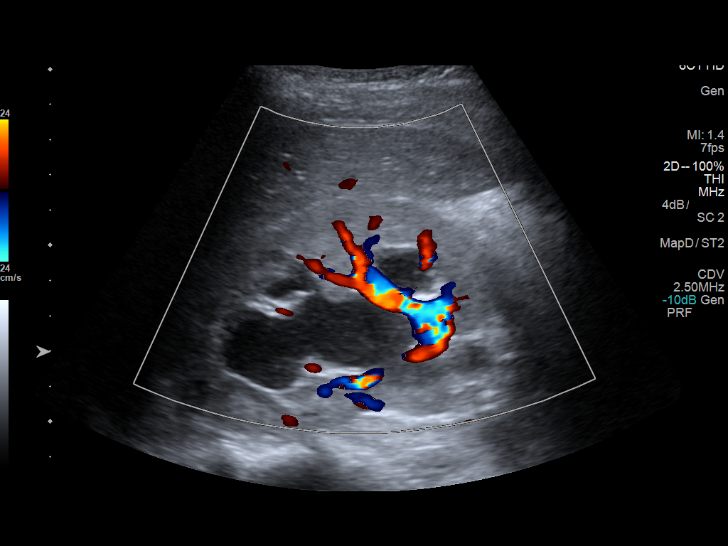
[im 30/45]
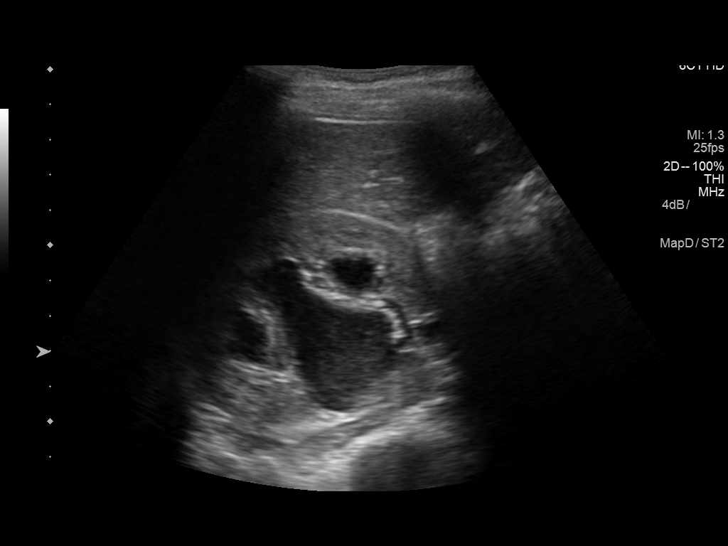
[im 34/45]
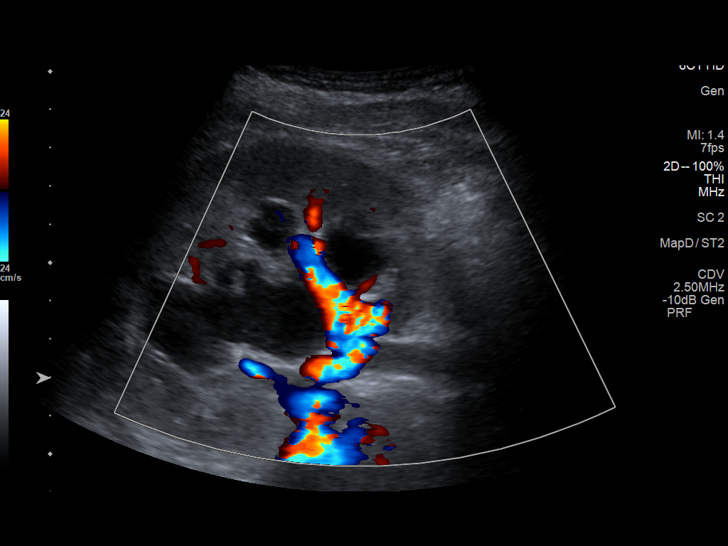
[im 37/45]
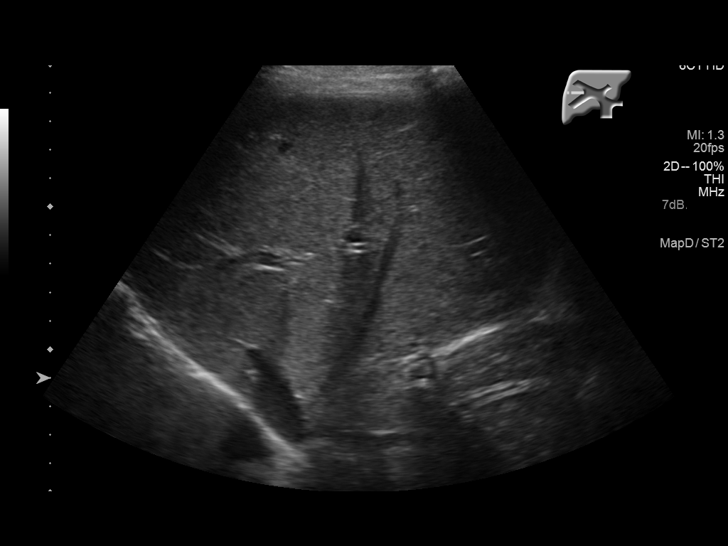
[im 41/45]
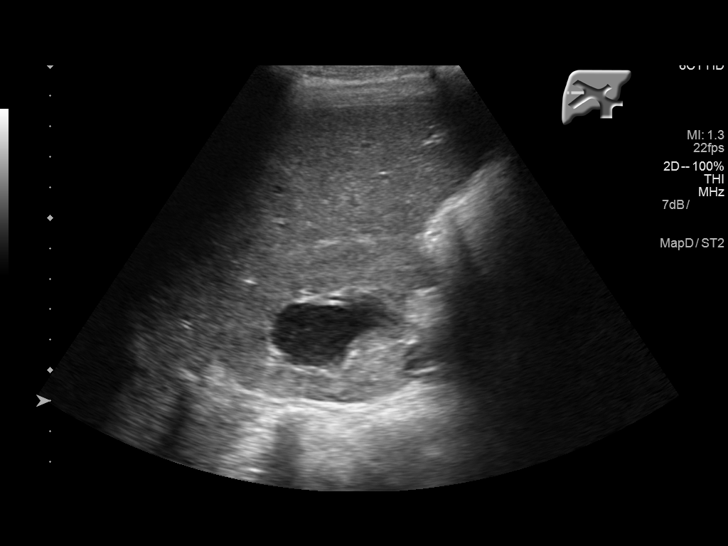
[im 45/45]
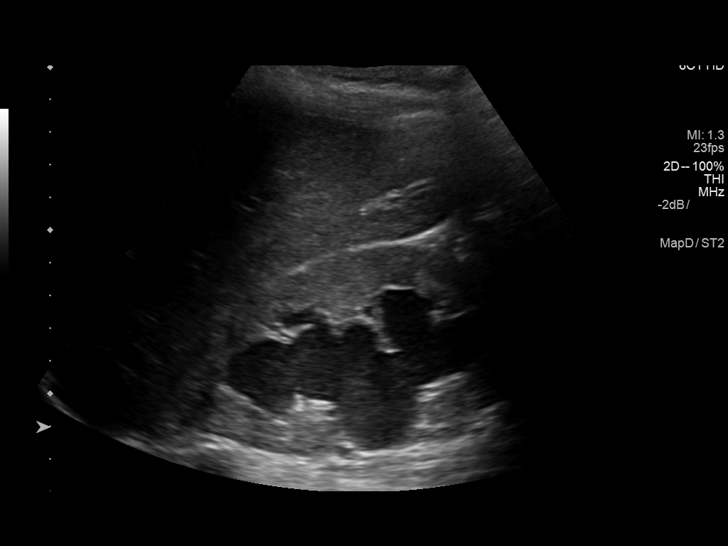

[14 of 25 positions shown; findings below may reference images not displayed]

FINDINGS: Gallbladder:

The gallbladder is surgically absent. There is no abnormality
observed within the gallbladder fossa.

Common bile duct:

Diameter: 3.2 mm

Liver:

The hepatic echotexture is normal. There is no discrete mass or
hydronephrosis.

There is severe right-sided hydronephrosis. Echogenic material was
floating in the dilated renal pelvis. The visualized portions of the
proximal right ureter are dilated.
IMPRESSION: There is severe right-sided hydronephrosis with some debris observed
within the right renal pelvis. The etiology for the obstruction is
not clear but may be related to the gravid uterus. The proximal few
cm of the right ureter are dilated.

Prior cholecystectomy. No evidence of biloma or abscess in the
gallbladder fossa. Normal appearance of the common bile duct and
liver.

## 2017-10-26 ENCOUNTER — Emergency Department: Payer: Medicaid Other

## 2017-10-26 ENCOUNTER — Emergency Department
Admission: EM | Admit: 2017-10-26 | Discharge: 2017-10-26 | Disposition: A | Discharge status: Home / Self Care | Payer: Medicaid Other | Attending: Emergency Medicine | Admitting: Emergency Medicine

## 2017-10-26 ENCOUNTER — Encounter: Payer: Self-pay | Admitting: Emergency Medicine

## 2017-10-26 ENCOUNTER — Other Ambulatory Visit: Payer: Self-pay

## 2017-10-26 DIAGNOSIS — Z79899 Other long term (current) drug therapy: Secondary | ICD-10-CM | POA: Diagnosis not present

## 2017-10-26 DIAGNOSIS — R197 Diarrhea, unspecified: Secondary | ICD-10-CM

## 2017-10-26 DIAGNOSIS — R102 Pelvic and perineal pain: Secondary | ICD-10-CM | POA: Diagnosis present

## 2017-10-26 DIAGNOSIS — F17210 Nicotine dependence, cigarettes, uncomplicated: Secondary | ICD-10-CM | POA: Insufficient documentation

## 2017-10-26 LAB — WET PREP, GENITAL
CLUE CELLS WET PREP: NONE SEEN
SPERM: NONE SEEN
TRICH WET PREP: NONE SEEN
YEAST WET PREP: NONE SEEN

## 2017-10-26 LAB — URINALYSIS, COMPLETE (UACMP) WITH MICROSCOPIC
BILIRUBIN URINE: NEGATIVE
Glucose, UA: NEGATIVE mg/dL
HGB URINE DIPSTICK: NEGATIVE
KETONES UR: NEGATIVE mg/dL
LEUKOCYTES UA: NEGATIVE
NITRITE: NEGATIVE
PH: 5 (ref 5.0–8.0)
Protein, ur: NEGATIVE mg/dL
SPECIFIC GRAVITY, URINE: 1.017 (ref 1.005–1.030)

## 2017-10-26 LAB — CHLAMYDIA/NGC RT PCR (ARMC ONLY)
CHLAMYDIA TR: NOT DETECTED
N gonorrhoeae: NOT DETECTED

## 2017-10-26 LAB — COMPREHENSIVE METABOLIC PANEL
ALK PHOS: 37 U/L — AB (ref 38–126)
ALT: 15 U/L (ref 14–54)
ANION GAP: 10 (ref 5–15)
AST: 19 U/L (ref 15–41)
Albumin: 4.7 g/dL (ref 3.5–5.0)
BUN: 9 mg/dL (ref 6–20)
CALCIUM: 9.3 mg/dL (ref 8.9–10.3)
CHLORIDE: 104 mmol/L (ref 101–111)
CO2: 24 mmol/L (ref 22–32)
Creatinine, Ser: 0.6 mg/dL (ref 0.44–1.00)
GFR calc Af Amer: >60 mL/min (ref >60)
GFR calc non Af Amer: >60 mL/min (ref >60)
GLUCOSE: 99 mg/dL (ref 65–99)
Potassium: 3.3 mmol/L — ABNORMAL LOW (ref 3.5–5.1)
SODIUM: 138 mmol/L (ref 135–145)
Total Bilirubin: 0.5 mg/dL (ref 0.3–1.2)
Total Protein: 7.9 g/dL (ref 6.5–8.1)

## 2017-10-26 LAB — CBC
HCT: 39.1 % (ref 35.0–47.0)
Hemoglobin: 13.2 g/dL (ref 12.0–16.0)
MCH: 28.9 pg (ref 26.0–34.0)
MCHC: 33.6 g/dL (ref 32.0–36.0)
MCV: 86.1 fL (ref 80.0–100.0)
PLATELETS: 213 10*3/uL (ref 150–440)
RBC: 4.54 MIL/uL (ref 3.80–5.20)
RDW: 13.4 % (ref 11.5–14.5)
WBC: 5.3 10*3/uL (ref 3.6–11.0)

## 2017-10-26 LAB — LIPASE, BLOOD: LIPASE: 25 U/L (ref 11–51)

## 2017-10-26 LAB — POCT PREGNANCY, URINE: Preg Test, Ur: NEGATIVE

## 2017-10-26 MED ORDER — OXYCODONE-ACETAMINOPHEN 5-325 MG PO TABS: 1.0000 | ORAL_TABLET | Freq: Four times a day (QID) | ORAL | 0 refills | Status: AC | PRN

## 2017-10-26 MED ORDER — OXYCODONE-ACETAMINOPHEN 5-325 MG PO TABS
1.0000 | ORAL_TABLET | Freq: Once | ORAL | Status: AC
  Administered 2017-10-26: 1 via ORAL
  Filled 2017-10-26: qty 1

## 2017-10-26 NOTE — ED Triage Notes (Addendum)
Pt reports lower abdominal pain below belly button for over 1 week, describes the pain as constant and sharp.  Denies any N/V/D c/o decreased appetite. Denies any pain with urination or vaginal discharge.   Eating and drinking do not change the pain. Pt has not had this type of pain before.  Last BM was today.

## 2017-10-26 NOTE — ED Notes (Signed)
Patient does not appear to be in any acute distress at time of discharge. Patient ambulatory to lobby with steady gate. Patient denies any comments or concerns regarding discharge.  

## 2017-10-26 NOTE — ED Notes (Signed)
Gave patient apple juice

## 2017-10-26 NOTE — ED Provider Notes (Addendum)
[facility] Emergency Department Provider Note  ____________________________________________   I have reviewed the triage vital signs and the nursing notes.   HISTORY  Chief Complaint Abdominal Pain    HPI Jessica Coffey is a 26 y.o. female who has a history of her urinary tract infections, pregnancy related issues, gallstones in the past, sickle cell trait as well as dysmenorrhea states that her normal period began a week ago.  She is not pregnant.  She states.  Started on Tuesday but the cramping is persisted.  It was a normal period with normal amount of bleeding and normal number of days of bleeding.  He was at the normal time.  However she has persisted with suprapubic discomfort.  She denies any other symptoms including nausea vomiting chest pain shortness of breath abdominal pain.  She has a slightly decreased appetite.  She denies any dysuria urinary frequency or vaginal discharge.  She is in a monogamous relationship has never had an STI, she denies any bowel changes aside from this morning when she had one loose diarrhea stool which was nonbloody.  It seemed to make her symptoms slightly better.  Nothing seems to make the symptoms worse.     Past Medical History:  Diagnosis Date  . Anemia   . Gallstones   . Oligohydramnios   . Sickle cell trait (HCC)   . UTI (lower urinary tract infection)     Patient Active Problem List   Diagnosis Date Noted  . Pregnancy 10/16/2016  . Preterm premature rupture of membranes (PPROM) with onset of labor after 24 hours of rupture in first trimester, antepartum 10/16/2016  . Uterus injury with open wound into cavity 08/26/2016  . Cholecystolithiasis affecting pregnancy, antepartum 08/25/2016  . Cholelithiasis affecting pregnancy in second trimester, antepartum 08/23/2016  . Nausea and vomiting in pregnancy   . Back pain affecting pregnancy in third trimester 12/12/2015  . Supervision of normal pregnancy in third  trimester 12/08/2015  . Benign gestational thrombocytopenia in third trimester (HCC) 11/20/2015  . Abdominal pain in pregnancy 11/06/2015  . Abdominal pain affecting pregnancy, antepartum 10/18/2015  . Abdominal pain affecting pregnancy 09/12/2015    Past Surgical History:  Procedure Laterality Date  . LAPAROSCOPIC CHOLECYSTECTOMY CONVERTED TO OPEN N/A 08/24/2016   Performed by Lattie Hawooper, Richard E, MD at [facility] ORS    Prior to Admission medications   Medication Sig Start Date End Date Taking? Authorizing Provider  acetaminophen (TYLENOL) 500 MG tablet Take 1,000 mg by mouth every 6 (six) hours as needed for mild pain or moderate pain.    [provider]  docusate sodium (COLACE) 100 MG capsule Take 1 capsule (100 mg total) by mouth 2 (two) times daily as needed for mild constipation. Patient not taking: Reported on 09/20/2016 11/20/15   [place] BingPickens, Charlie, MD  ferrous gluconate (FERGON) 324 MG tablet Take 1 tablet (324 mg total) by mouth 2 (two) times daily with a meal. Patient not taking: Reported on 09/20/2016 11/20/15   [place] BingPickens, Charlie, MD  HYDROcodone-acetaminophen (NORCO/VICODIN) 5-325 MG tablet Take 1-2 tablets by mouth every 6 (six) hours as needed for moderate pain or severe pain. Patient not taking: Reported on 09/20/2016 08/28/16   Christeen DouglasBeasley, Beth[place], MD  pantoprazole (PROTONIX) 20 MG tablet Take 1 tablet (20 mg total) by mouth daily. 07/27/17 07/27/18  Jene EveryKinner, Robert, MD  Prenatal Vit-Fe Fumarate-FA (PRENATAL MULTIVITAMIN) TABS tablet Take 1 tablet by mouth daily at 12 noon.    [provider]    Allergies Patient has  no known allergies.  History reviewed. No pertinent family history.  Social History Social History   Tobacco Use  . Smoking status: Light Tobacco Smoker    Packs/day: 0.25    Types: Cigarettes  . Smokeless tobacco: Never Used  Substance Use Topics  . Alcohol use: No  . Drug use: No    Review of Systems Constitutional: No fever/chills Eyes:  No visual changes. ENT: No sore throat. No stiff neck no neck pain Cardiovascular: Denies chest pain. Respiratory: Denies shortness of breath. Gastrointestinal:   no vomiting.  No diarrhea.  No constipation. Genitourinary: Negative for dysuria. Musculoskeletal: Negative lower extremity swelling Skin: Negative for rash. Neurological: Negative for severe headaches, focal weakness or numbness.   ____________________________________________   PHYSICAL EXAM:  VITAL SIGNS: ED Triage Vitals  Enc Vitals Group     BP 10/26/17 1139 109/70     Pulse Rate 10/26/17 1139 81     Resp 10/26/17 1139 18     Temp 10/26/17 1139 98.4 F (36.9 C)     Temp Source 10/26/17 1139 Oral     SpO2 --      Weight 10/26/17 1139 128 lb (58.1 kg)     Height 10/26/17 1139 5\' 5"  (1.651 m)     Head Circumference --      Peak Flow --      Pain Score 10/26/17 1142 10     Pain Loc --      Pain Edu? --      Excl. in GC? --     Constitutional: Alert and oriented. Well appearing and in no acute distress. Eyes: Conjunctivae are normal Head: Atraumatic HEENT: No congestion/rhinnorhea. Mucous membranes are moist.  Oropharynx non-erythematous Neck:   Nontender with no meningismus, no masses, no stridor Cardiovascular: Normal rate, regular rhythm. Grossly normal heart sounds.  Good peripheral circulation. Respiratory: Normal respiratory effort.  No retractions. Lungs CTAB. Abdominal: Soft and minimal suprapubic tenderness, no right lower quadrant pain no right upper quadrant pain, no left upper quadrant pain no left lower quadrant pain, just suprapubic minimal tenderness. No distention. No guarding no rebound Back:  There is no focal tenderness or step off.  there is no midline tenderness there are no lesions noted. there is no CVA tenderness Pelvic exam: Female nurse chaperone present, no external lesions noted, physiologic vaginal discharge noted with no purulent discharge, no cervical motion tenderness, no adnexal  tenderness or mass, there is old uterine tenderness reproduces her pain, no mass noted. No vaginal bleeding Musculoskeletal: No lower extremity tenderness, no upper extremity tenderness. No joint effusions, no DVT signs strong distal pulses no edema Neurologic:  Normal speech and language. No gross focal neurologic deficits are appreciated.  Skin:  Skin is warm, dry and intact. No rash noted. Psychiatric: Mood and affect are normal. Speech and behavior are normal.  ____________________________________________   LABS (all labs ordered are listed, but only abnormal results are displayed)  Labs Reviewed  WET PREP, GENITAL - Abnormal; Notable for the following components:      Result Value   WBC, Wet Prep HPF POC RARE (*)    All other components within normal limits  COMPREHENSIVE METABOLIC PANEL - Abnormal; Notable for the following components:   Potassium 3.3 (*)    Alkaline Phosphatase 37 (*)    All other components within normal limits  URINALYSIS, COMPLETE (UACMP) WITH MICROSCOPIC - Abnormal; Notable for the following components:   Color, Urine YELLOW (*)    APPearance HAZY (*)  Bacteria, UA MANY (*)    Squamous Epithelial / LPF 0-5 (*)    All other components within normal limits  CHLAMYDIA/NGC RT PCR ([facility] ONLY)  LIPASE, BLOOD  CBC  POC URINE PREG, ED  POCT PREGNANCY, URINE    Pertinent labs  results that were available during my care of the patient were reviewed by me and considered in my medical decision making (see chart for details). ____________________________________________  EKG  I personally interpreted any EKGs ordered by me or triage  ____________________________________________  RADIOLOGY  Pertinent labs & imaging results that were available during my care of the patient were reviewed by me and considered in my medical decision making (see chart for details). If possible, patient and/or family made aware of any abnormal findings.  No results  found. ____________________________________________    PROCEDURES  Procedure(s) performed: None  Procedures  Critical Care performed: None  ____________________________________________   INITIAL IMPRESSION / ASSESSMENT AND PLAN / ED COURSE  Pertinent labs & imaging results that were available during my care of the patient were reviewed by me and considered in my medical decision making (see chart for details).  Patient here with cramping pain that persists after her menstrual period, associated with mild diarrhea this patient also has a history of significant cramping pain with her menstrual periods and dysmenorrhea.  Low suspicion for appendicitis, gallbladder disease, diverticulitis.  Patient very comfortable in the bed, watching television with her legs crossed.  We have given her pain medication here obtain an ultrasound, differential does include cyst TOA or PID as well as STI although there is no strong clinical correlation with any of these disease processes.  ----------------------------------------- 3:24 PM on 10/26/2017 -----------------------------------------  Considering the patient's symptoms, medical history, and physical examination today, I have low suspicion for cholecystitis or biliary pathology, pancreatitis, perforation or bowel obstruction, hernia, intra-abdominal abscess, AAA or dissection, volvulus or intussusception, mesenteric ischemia, ischemic gut, pyelonephritis or appendicitis.  Patient in no acute distress, abdomen remains benign extensive return precautions and follow-up given and understood CT not indicated at this time.      ____________________________________________   FINAL CLINICAL IMPRESSION(S) / ED DIAGNOSES  Final diagnoses:  Pelvic pain      This chart was dictated using voice recognition software.  Despite best efforts to proofread,  errors can occur which can change meaning.      Jeanmarie PlantMcShane, James A, MD 10/26/17 1330     Jeanmarie PlantMcShane, James A, MD 10/26/17 [id number]

## 2017-11-04 ENCOUNTER — Other Ambulatory Visit: Payer: Self-pay

## 2017-11-04 DIAGNOSIS — F17210 Nicotine dependence, cigarettes, uncomplicated: Secondary | ICD-10-CM | POA: Insufficient documentation

## 2017-11-04 DIAGNOSIS — Z79899 Other long term (current) drug therapy: Secondary | ICD-10-CM | POA: Diagnosis not present

## 2017-11-04 DIAGNOSIS — R1032 Left lower quadrant pain: Secondary | ICD-10-CM | POA: Insufficient documentation

## 2017-11-04 DIAGNOSIS — M545 Low back pain: Secondary | ICD-10-CM | POA: Insufficient documentation

## 2017-11-04 DIAGNOSIS — R197 Diarrhea, unspecified: Secondary | ICD-10-CM | POA: Diagnosis not present

## 2017-11-04 LAB — CBC
HCT: 37.2 % (ref 35.0–47.0)
Hemoglobin: 12.6 g/dL (ref 12.0–16.0)
MCH: 29.1 pg (ref 26.0–34.0)
MCHC: 33.9 g/dL (ref 32.0–36.0)
MCV: 86.1 fL (ref 80.0–100.0)
PLATELETS: 174 10*3/uL (ref 150–440)
RBC: 4.32 MIL/uL (ref 3.80–5.20)
RDW: 13.1 % (ref 11.5–14.5)
WBC: 4.6 10*3/uL (ref 3.6–11.0)

## 2017-11-04 LAB — POCT PREGNANCY, URINE: PREG TEST UR: NEGATIVE

## 2017-11-04 NOTE — ED Triage Notes (Signed)
Pt in with co lower abd pain and low back pain x 2 weeks. Was here 2 weeks ago for abd pain but all tests were normal. Denies any dysuria, no n.v.d.

## 2017-11-05 ENCOUNTER — Emergency Department
Admission: EM | Admit: 2017-11-05 | Discharge: 2017-11-05 | Disposition: A | Discharge status: Home / Self Care | Payer: Medicaid Other | Attending: Emergency Medicine | Admitting: Emergency Medicine

## 2017-11-05 DIAGNOSIS — R1032 Left lower quadrant pain: Secondary | ICD-10-CM

## 2017-11-05 DIAGNOSIS — M545 Low back pain, unspecified: Secondary | ICD-10-CM

## 2017-11-05 LAB — URINALYSIS, COMPLETE (UACMP) WITH MICROSCOPIC
BILIRUBIN URINE: NEGATIVE
Glucose, UA: NEGATIVE mg/dL
Hgb urine dipstick: NEGATIVE
KETONES UR: NEGATIVE mg/dL
Leukocytes, UA: NEGATIVE
NITRITE: NEGATIVE
PROTEIN: 30 mg/dL — AB
Specific Gravity, Urine: 1.015 (ref 1.005–1.030)
pH: 5 (ref 5.0–8.0)

## 2017-11-05 LAB — COMPREHENSIVE METABOLIC PANEL
ALT: 22 U/L (ref 14–54)
AST: 21 U/L (ref 15–41)
Albumin: 4.6 g/dL (ref 3.5–5.0)
Alkaline Phosphatase: 33 U/L — ABNORMAL LOW (ref 38–126)
Anion gap: 9 (ref 5–15)
BILIRUBIN TOTAL: 0.6 mg/dL (ref 0.3–1.2)
BUN: 12 mg/dL (ref 6–20)
CHLORIDE: 107 mmol/L (ref 101–111)
CO2: 24 mmol/L (ref 22–32)
CREATININE: 0.58 mg/dL (ref 0.44–1.00)
Calcium: 9.6 mg/dL (ref 8.9–10.3)
Glucose, Bld: 97 mg/dL (ref 65–99)
Potassium: 3.7 mmol/L (ref 3.5–5.1)
Sodium: 140 mmol/L (ref 135–145)
TOTAL PROTEIN: 7.9 g/dL (ref 6.5–8.1)

## 2017-11-05 MED ORDER — DICYCLOMINE HCL 10 MG PO CAPS
20.0000 mg | ORAL_CAPSULE | Freq: Once | ORAL | Status: AC
  Administered 2017-11-05: 20 mg via ORAL
  Filled 2017-11-05: qty 2

## 2017-11-05 MED ORDER — DICYCLOMINE HCL 20 MG PO TABS: 20.0000 mg | ORAL_TABLET | Freq: Three times a day (TID) | ORAL | 0 refills | Status: AC | PRN

## 2017-11-05 NOTE — ED Notes (Signed)
Pt signed hard copy of ED discharge and placed on chart 

## 2017-11-05 NOTE — ED Provider Notes (Signed)
[facility] Emergency Department Provider Note  Time seen: 1:27 AM  I have reviewed the triage vital signs and the nursing notes.   HISTORY  Chief Complaint Back Pain and Abdominal Pain    HPI Jessica Coffey is a 26 y.o. female with a past medical history of anemia, presents to the emergency department for lower abdominal pain and lower back pain.  According to the patient she was seen in the emergency department 2 weeks ago for lower abdominal pain.  Patient had a negative workup at that time and states the pain went away.  She states 2 days ago she developed diarrhea and had once again developed lower abdominal pain and this time also having some pain in her lower back.  Patient states nausea but denies any vomiting.  Denies any fever.  Denies any dysuria, hematuria, vaginal discharge.  Describes her lower abdominal discomfort is mild aching pain currently.  Past Medical History:  Diagnosis Date  . Anemia   . Gallstones   . Oligohydramnios   . Sickle cell trait (HCC)   . UTI (lower urinary tract infection)     Patient Active Problem List   Diagnosis Date Noted  . Pregnancy 10/16/2016  . Preterm premature rupture of membranes (PPROM) with onset of labor after 24 hours of rupture in first trimester, antepartum 10/16/2016  . Uterus injury with open wound into cavity 08/26/2016  . Cholecystolithiasis affecting pregnancy, antepartum 08/25/2016  . Cholelithiasis affecting pregnancy in second trimester, antepartum 08/23/2016  . Nausea and vomiting in pregnancy   . Back pain affecting pregnancy in third trimester 12/12/2015  . Supervision of normal pregnancy in third trimester 12/08/2015  . Benign gestational thrombocytopenia in third trimester (HCC) 11/20/2015  . Abdominal pain in pregnancy 11/06/2015  . Abdominal pain affecting pregnancy, antepartum 10/18/2015  . Abdominal pain affecting pregnancy 09/12/2015    Past Surgical History:  Procedure Laterality  Date  . CHOLECYSTECTOMY N/A 08/24/2016   Procedure: LAPAROSCOPIC CHOLECYSTECTOMY CONVERTED TO OPEN;  Surgeon: Lattie Hawichard E Cooper, MD;  Location: [facility] ORS;  Service: General;  Laterality: N/A;    Prior to Admission medications   Medication Sig Start Date End Date Taking? Authorizing Provider  acetaminophen (TYLENOL) 500 MG tablet Take 1,000 mg by mouth every 6 (six) hours as needed for mild pain or moderate pain.    [provider]  docusate sodium (COLACE) 100 MG capsule Take 1 capsule (100 mg total) by mouth 2 (two) times daily as needed for mild constipation. Patient not taking: Reported on 09/20/2016 11/20/15   [place] BingPickens, Charlie, MD  ferrous gluconate (FERGON) 324 MG tablet Take 1 tablet (324 mg total) by mouth 2 (two) times daily with a meal. Patient not taking: Reported on 09/20/2016 11/20/15    BingPickens, Charlie, MD  HYDROcodone-acetaminophen (NORCO/VICODIN) 5-325 MG tablet Take 1-2 tablets by mouth every 6 (six) hours as needed for moderate pain or severe pain. Patient not taking: Reported on 09/20/2016 08/28/16   Christeen DouglasBeasley, Bethany, MD  oxyCODONE-acetaminophen (ROXICET) 5-325 MG tablet Take 1 tablet every 6 (six) hours as needed by mouth. 10/26/17 10/26/18  Jeanmarie PlantMcShane, James A, MD  pantoprazole (PROTONIX) 20 MG tablet Take 1 tablet (20 mg total) by mouth daily. 07/27/17 07/27/18  Jene EveryKinner, Robert, MD  Prenatal Vit-Fe Fumarate-FA (PRENATAL MULTIVITAMIN) TABS tablet Take 1 tablet by mouth daily at 12 noon.    [provider]    No Known Allergies  No family history on file.  Social History Social History   Tobacco Use  .  Smoking status: Light Tobacco Smoker    Packs/day: 0.25    Types: Cigarettes  . Smokeless tobacco: Never Used  Substance Use Topics  . Alcohol use: No  . Drug use: No    Review of Systems Constitutional: Negative for fever. Cardiovascular: Negative for chest pain. Respiratory: Negative for shortness of breath. Gastrointestinal: Abdominal discomfort.   Positive for nausea.  Positive for diarrhea times 2 days. Genitourinary: Negative for dysuria.  Negative for discharge. Musculoskeletal: Mild lower back pain. Neurological: Negative for headache All other ROS negative  ____________________________________________   PHYSICAL EXAM:  VITAL SIGNS: ED Triage Vitals  Enc Vitals Group     BP 11/04/17 2341 114/78     Pulse Rate 11/04/17 2341 88     Resp 11/04/17 2341 20     Temp 11/04/17 2341 98.6 F (37 C)     Temp Source 11/04/17 2341 Oral     SpO2 11/04/17 2341 100 %     Weight 11/04/17 2342 129 lb (58.5 kg)     Height 11/04/17 2342 5\' 5"  (1.651 m)     Head Circumference --      Peak Flow --      Pain Score 11/04/17 2341 10     Pain Loc --      Pain Edu? --      Excl. in GC? --    Constitutional: Alert and oriented. Well appearing and in no distress. Eyes: Normal exam ENT   Head: Normocephalic and atraumatic.   Mouth/Throat: Mucous membranes are moist. Cardiovascular: Normal rate, regular rhythm. No murmur Respiratory: Normal respiratory effort without tachypnea nor retractions. Breath sounds are clear Gastrointestinal: Soft, mild left lower quadrant tenderness to palpation, no rebound or guarding.  No distention.  Abdomen otherwise benign.  No CVA tenderness. Musculoskeletal: Nontender with normal range of motion in all extremities.  Mild lumbar paraspinal tenderness to palpation bilaterally. Neurologic:  Normal speech and language. No gross focal neurologic deficits  Skin:  Skin is warm, dry and intact.  Psychiatric: Mood and affect are normal.   ____________________________________________   INITIAL IMPRESSION / ASSESSMENT AND PLAN / ED COURSE  Pertinent labs & imaging results that were available during my care of the patient were reviewed by me and considered in my medical decision making (see chart for details).  Patient presents to the emergency department for lower abdominal and lower back pain.   Differential would include urinary tract infection, pelvic infection, musculoskeletal pain, ovarian cyst, colitis, diverticulitis, gastroenteritis, enteritis.  Overall the patient appears extremely well, largely normal physical examination besides slight left lower quadrant tenderness and mild lumbar paraspinal tenderness to palpation.  Patient's labs are within normal limits including a normal white blood cell count, LFTs and urinalysis.  I reviewed the patient's records including her recent ER visit 10/26/17.  At that time the patient also had a normal workup with lab work, pelvic examination including STD testing and wet prep as well as a pelvic ultrasound.  Given the patient's overall very well appearance with a non-concerning physical examination with normal vitals, afebrile and a normal white blood cell count as well as normal lab values.  I discussed with the patient treating with medication such as Bentyl and fiber supplements.  I discussed with the patient for her pain did not improve over the next 2 days or if the pain worsens or she developed a fever she is return to the emergency department for further evaluation.  The patient is agreeable to this plan.  At  this time with normal vitals and labs as well as a fairly non-concerning examination I do not believe the patient would benefit from CT imaging and at this time I believe the risk would outweigh the benefit.  I discussed this with the patient and she is agreeable to hold off on CT imaging.  ____________________________________________   FINAL CLINICAL IMPRESSION(S) / ED DIAGNOSES  Lower abdominal pain Diarrhea    [name], MD 11/05/17 0131

## 2017-11-05 NOTE — Discharge Instructions (Signed)
Please take your medication as prescribed, as needed for abdominal discomfort.  As we discussed please also purchase an over-the-counter fiber supplement such as Metamucil powder and use daily with a large glass of water as written on the box to help bulk stool.  Return to the emergency department for any worsening abdominal pain or fever.  Otherwise please follow-up with a primary care doctor in the next 2 days for recheck/reevaluation.

## 2017-11-05 NOTE — ED Notes (Signed)

## 2018-03-30 ENCOUNTER — Other Ambulatory Visit: Payer: Self-pay

## 2018-03-30 ENCOUNTER — Emergency Department: Payer: Medicaid Other

## 2018-03-30 ENCOUNTER — Emergency Department
Admission: EM | Admit: 2018-03-30 | Discharge: 2018-03-30 | Disposition: A | Discharge status: Home / Self Care | Payer: Medicaid Other | Attending: Emergency Medicine | Admitting: Emergency Medicine

## 2018-03-30 ENCOUNTER — Encounter: Payer: Self-pay | Admitting: Emergency Medicine

## 2018-03-30 DIAGNOSIS — Y939 Activity, unspecified: Secondary | ICD-10-CM | POA: Insufficient documentation

## 2018-03-30 DIAGNOSIS — F17210 Nicotine dependence, cigarettes, uncomplicated: Secondary | ICD-10-CM | POA: Insufficient documentation

## 2018-03-30 DIAGNOSIS — S9001XA Contusion of right ankle, initial encounter: Secondary | ICD-10-CM | POA: Insufficient documentation

## 2018-03-30 DIAGNOSIS — Y999 Unspecified external cause status: Secondary | ICD-10-CM | POA: Diagnosis not present

## 2018-03-30 DIAGNOSIS — Y92009 Unspecified place in unspecified non-institutional (private) residence as the place of occurrence of the external cause: Secondary | ICD-10-CM | POA: Insufficient documentation

## 2018-03-30 DIAGNOSIS — Z79899 Other long term (current) drug therapy: Secondary | ICD-10-CM | POA: Diagnosis not present

## 2018-03-30 DIAGNOSIS — W228XXA Striking against or struck by other objects, initial encounter: Secondary | ICD-10-CM | POA: Diagnosis not present

## 2018-03-30 DIAGNOSIS — S99911A Unspecified injury of right ankle, initial encounter: Secondary | ICD-10-CM | POA: Diagnosis present

## 2018-03-30 MED ORDER — MELOXICAM 15 MG PO TABS: 15.0000 mg | ORAL_TABLET | Freq: Every day | ORAL | 0 refills | Status: AC

## 2018-03-30 MED ORDER — MELOXICAM 7.5 MG PO TABS
15.0000 mg | ORAL_TABLET | Freq: Once | ORAL | Status: AC
  Administered 2018-03-30: 15 mg via ORAL
  Filled 2018-03-30: qty 2

## 2018-03-30 NOTE — ED Provider Notes (Signed)
[facility] Emergency Department Provider Note  ____________________________________________  Time seen: Approximately 6:57 PM  I have reviewed the triage vital signs and the nursing notes.   HISTORY  Chief Complaint Ankle Pain    HPI Jessica Coffey is a 27 y.o. female who presents the emergency department complaining of right ankle pain.  Patient reports that she was at home, accidentally shot her screen door on her ankle 2 days prior.  Patient reports initially she thought it was just "bruised".  However symptoms did not improve with Tylenol Motrin.  Patient reports there is pain with ambulation but she is able to ambulate.  She denies any lacerations or abrasions to the foot.  No other injury or complaint.  Past Medical History:  Diagnosis Date  . Anemia   . Gallstones   . Oligohydramnios   . Sickle cell trait (HCC)   . UTI (lower urinary tract infection)     Patient Active Problem List   Diagnosis Date Noted  . Pregnancy 10/16/2016  . Preterm premature rupture of membranes (PPROM) with onset of labor after 24 hours of rupture in first trimester, antepartum 10/16/2016  . Uterus injury with open wound into cavity 08/26/2016  . Cholecystolithiasis affecting pregnancy, antepartum 08/25/2016  . Cholelithiasis affecting pregnancy in second trimester, antepartum 08/23/2016  . Nausea and vomiting in pregnancy   . Back pain affecting pregnancy in third trimester 12/12/2015  . Supervision of normal pregnancy in third trimester 12/08/2015  . Benign gestational thrombocytopenia in third trimester (HCC) 11/20/2015  . Abdominal pain in pregnancy 11/06/2015  . Abdominal pain affecting pregnancy, antepartum 10/18/2015  . Abdominal pain affecting pregnancy 09/12/2015    Past Surgical History:  Procedure Laterality Date  . CHOLECYSTECTOMY N/A 08/24/2016   Procedure: LAPAROSCOPIC CHOLECYSTECTOMY CONVERTED TO OPEN;  Surgeon: Lattie Haw, MD;  Location: [facility]  ORS;  Service: General;  Laterality: N/A;    Prior to Admission medications   Medication Sig Start Date End Date Taking? Authorizing Provider  acetaminophen (TYLENOL) 500 MG tablet Take 1,000 mg by mouth every 6 (six) hours as needed for mild pain or moderate pain.    [provider]  dicyclomine (BENTYL) 20 MG tablet Take 1 tablet (20 mg total) by mouth 3 (three) times daily as needed for spasms. 11/05/17 11/05/18  [name], MD  docusate sodium (COLACE) 100 MG capsule Take 1 capsule (100 mg total) by mouth 2 (two) times daily as needed for mild constipation. Patient not taking: Reported on 09/20/2016 11/20/15   [place] Bing, MD  ferrous gluconate (FERGON) 324 MG tablet Take 1 tablet (324 mg total) by mouth 2 (two) times daily with a meal. Patient not taking: Reported on 09/20/2016 11/20/15    Bing, MD  HYDROcodone-acetaminophen (NORCO/VICODIN) 5-325 MG tablet Take 1-2 tablets by mouth every 6 (six) hours as needed for moderate pain or severe pain. Patient not taking: Reported on 09/20/2016 08/28/16   Christeen Douglas, MD  meloxicam (MOBIC) 15 MG tablet Take 1 tablet (15 mg total) by mouth daily. 03/30/18   Aedyn Mckeon, Delorise Royals, PA-C  oxyCODONE-acetaminophen (ROXICET) 5-325 MG tablet Take 1 tablet every 6 (six) hours as needed by mouth. 10/26/17 10/26/18  Jeanmarie Plant, MD  pantoprazole (PROTONIX) 20 MG tablet Take 1 tablet (20 mg total) by mouth daily. 07/27/17 07/27/18  Jene Every, MD  Prenatal Vit-Fe Fumarate-FA (PRENATAL MULTIVITAMIN) TABS tablet Take 1 tablet by mouth daily at 12 noon.    [provider]    Allergies Patient has  no known allergies.  History reviewed. No pertinent family history.  Social History Social History   Tobacco Use  . Smoking status: Light Tobacco Smoker    Packs/day: 0.25    Types: Cigarettes  . Smokeless tobacco: Never Used  Substance Use Topics  . Alcohol use: No  . Drug use: No     Review of Systems   Constitutional: No fever/chills Eyes: No visual changes.  Cardiovascular: no chest pain. Respiratory: no cough. No SOB. Gastrointestinal: No abdominal pain.  No nausea, no vomiting.  Musculoskeletal: Positive for right ankle pain Skin: Negative for rash, abrasions, lacerations, ecchymosis. Neurological: Negative for headaches, focal weakness or numbness. 10-point ROS otherwise negative.  ____________________________________________   PHYSICAL EXAM:  VITAL SIGNS: ED Triage Vitals  Enc Vitals Group     BP 03/30/18 1645 122/78     Pulse Rate 03/30/18 1645 80     Resp 03/30/18 1645 18     Temp 03/30/18 1645 98.8 F (37.1 C)     Temp Source 03/30/18 1645 Oral     SpO2 03/30/18 1645 100 %     Weight 03/30/18 1716 130 lb (59 kg)     Height 03/30/18 1716 5\' 5"  (1.651 m)     Head Circumference --      Peak Flow --      Pain Score 03/30/18 1716 10     Pain Loc --      Pain Edu? --      Excl. in GC? --      Constitutional: Alert and oriented. Well appearing and in no acute distress. Eyes: Conjunctivae are normal. PERRL. EOMI. Head: Atraumatic. Neck: No stridor.    Cardiovascular: Normal rate, regular rhythm. Normal S1 and S2.  Good peripheral circulation. Respiratory: Normal respiratory effort without tachypnea or retractions. Lungs CTAB. Good air entry to the bases with no decreased or absent breath sounds. Musculoskeletal: Full range of motion to all extremities. No gross deformities appreciated.  No abrasions, lacerations, ecchymosis, edema noted to the right ankle upon inspection.  Full range of motion.  Patient is tender to palpation diffusely on the anterior ankle with no specific point tenderness.  No palpable abnormality.  Dorsalis pedis pulse intact.  Sensation intact all 5 digits. Neurologic:  Normal speech and language. No gross focal neurologic deficits are appreciated.  Skin:  Skin is warm, dry and intact. No rash noted. Psychiatric: Mood and affect are normal. Speech  and behavior are normal. Patient exhibits appropriate insight and judgement.   ____________________________________________   LABS (all labs ordered are listed, but only abnormal results are displayed)  Labs Reviewed - No data to display ____________________________________________  EKG   ____________________________________________  RADIOLOGY Festus BarrenI, Delylah Stanczyk D Zykee Avakian, personally viewed and evaluated these images (plain radiographs) as part of my medical decision making, as well as reviewing the written report by the radiologist.  I concur with radiologist finding of no acute osseous abnormality to the right ankle.  Dg Ankle Complete Right  Result Date: 03/30/2018 CLINICAL DATA:  Ankle pain EXAM: RIGHT ANKLE - COMPLETE 3+ VIEW COMPARISON:  None. FINDINGS: There is no evidence of fracture, dislocation, or joint effusion. There is no evidence of arthropathy or other focal bone abnormality. Soft tissues are unremarkable. IMPRESSION: Negative. Electronically Signed   By: Jasmine PangKim  Fujinaga M.D.   On: 03/30/2018 17:53    ____________________________________________    PROCEDURES  Procedure(s) performed:    Procedures    Medications  meloxicam (MOBIC) tablet 15 mg (has no administration in  time range)     ____________________________________________   INITIAL IMPRESSION / ASSESSMENT AND PLAN / ED COURSE  Pertinent labs & imaging results that were available during my care of the patient were reviewed by me and considered in my medical decision making (see chart for details).  Review of the [place] CSRS was performed in accordance of the [facility] prior to dispensing any controlled drugs.     Patient's diagnosis is consistent with right ankle contusion.  Exam is reassuring.  X-ray reveals no acute osseous abnormality. Patient is given crutches for ambulation. Patient will be discharged home with prescriptions for meloxicam. Patient is to follow up with orthopedics as needed or otherwise  directed. Patient is given ED precautions to return to the ED for any worsening or new symptoms.     ____________________________________________  FINAL CLINICAL IMPRESSION(S) / ED DIAGNOSES  Final diagnoses:  Contusion of right ankle, initial encounter      NEW MEDICATIONS STARTED DURING THIS VISIT:  ED Discharge Orders        Ordered    meloxicam (MOBIC) 15 MG tablet  Daily     03/30/18 1906          This chart was dictated using voice recognition software/Dragon. Despite best efforts to proofread, errors can occur which can change the meaning. Any change was purely unintentional.    Racheal Patches, PA-C 03/30/18 Alease Medina, MD 03/30/18 [phone]

## 2018-03-30 NOTE — ED Triage Notes (Signed)
Pt pov to triage for right ankle pain that started Friday when she shut a door on it. + pulse. Pt has been able to ambulate with pain.

## 2019-10-20 NOTE — Telephone Encounter (Signed)
error 

## 2021-03-23 ENCOUNTER — Emergency Department: Admit: 2021-03-23 | Payer: MEDICAID

## 2021-03-23 ENCOUNTER — Inpatient Hospital Stay
Admit: 2021-03-23 | Discharge: 2021-03-23 | Disposition: A | Discharge status: Home / Self Care | Payer: MEDICAID | Attending: Emergency Medicine

## 2021-03-23 DIAGNOSIS — N83201 Unspecified ovarian cyst, right side: Secondary | ICD-10-CM

## 2021-03-23 LAB — URINALYSIS W/ REFLEX CULTURE
BACTERIA, URINE: NEGATIVE /hpf
Bacteria: NEGATIVE /hpf
Bilirubin, Urine: NEGATIVE
Bilirubin: NEGATIVE
Blood, Urine: NEGATIVE
Blood: NEGATIVE
Glucose, Ur: NEGATIVE mg/dL
Glucose: NEGATIVE mg/dL
Nitrite, Urine: NEGATIVE
Nitrites: NEGATIVE
Protein, UA: NEGATIVE mg/dL
Protein: NEGATIVE mg/dL
Specific Gravity, UA: 1.015 (ref 1.003–1.030)
Specific gravity: 1.015 (ref 1.003–1.030)
Urobilinogen, UA, POCT: 0.2 EU/dL (ref 0.2–1.0)
Urobilinogen: 0.2 EU/dL (ref 0.2–1.0)
pH (UA): 7 (ref 5.0–8.0)
pH, UA: 7 (ref 5.0–8.0)

## 2021-03-23 LAB — WET PREP
Clue Cells: ABSENT
Clue cells: ABSENT
Wet Prep, Trich: NONE SEEN
Wet prep: NONE SEEN

## 2021-03-23 LAB — HCG URINE, QL. - POC
HCG, Pregnancy, Urine, POC: NEGATIVE
Pregnancy test,urine (POC): NEGATIVE

## 2021-03-23 LAB — KOH, OTHER SOURCES
KOH: NONE SEEN
KOH: NONE SEEN

## 2021-03-23 MED ORDER — ACETAMINOPHEN 325 MG TABLET
325 mg | Freq: Once | ORAL | Status: AC
Start: 2021-03-23 — End: 2021-03-23
  Administered 2021-03-23: 23:00:00 via ORAL

## 2021-03-23 MED ORDER — KETOROLAC TROMETHAMINE 30 MG/ML INJECTION
30 mg/mL (1 mL) | INTRAMUSCULAR | Status: AC
Start: 2021-03-23 — End: 2021-03-23
  Administered 2021-03-23: 23:00:00 via INTRAMUSCULAR

## 2021-03-23 MED ORDER — PHENAZOPYRIDINE 200 MG TAB
200 mg | ORAL_TABLET | Freq: Three times a day (TID) | ORAL | 0 refills | Status: AC
Start: 2021-03-23 — End: 2021-03-25

## 2021-03-23 MED FILL — ACETAMINOPHEN 325 MG TABLET: 325 mg | ORAL | Qty: 2

## 2021-03-23 MED FILL — KETOROLAC TROMETHAMINE 30 MG/ML INJECTION: 30 mg/mL (1 mL) | INTRAMUSCULAR | Qty: 1

## 2021-03-23 NOTE — ED Notes (Signed)
Report given to next shift nurse.

## 2021-03-23 NOTE — ED Notes (Signed)
Emergency Department Nursing Plan of Care       The Nursing Plan of Care is developed from the Nursing assessment and Emergency Department Attending provider initial evaluation.  The plan of care may be reviewed in the "ED Provider note".    The Plan of Care was developed with the following considerations:   Patient / Family readiness to learn indicated UJ:[id number] understanding  Persons(s) to be included in education: patient  Barriers to Learning/Limitations:No    Signed     Lanelle Bal, RN    03/23/2021   4:20 PM

## 2021-03-23 NOTE — ED Notes (Signed)
C/o lower abdominal pain x 1 week with diarrhea for the past 2 days. Pt also c/o dysuria and frequency.

## 2021-03-23 NOTE — ED Notes (Signed)
 Patient  given copy of dc instructions and 0 paper script(s) and 1 electronic scripts.  Patient  verbalized understanding of instructions and script (s).  Patient given a current medication reconciliation form and verbalized understanding of their medications.   Patient  verbalized understanding of the importance of discussing medications with  his or her physician or clinic they will be following up with.  Patient alert and oriented and in no acute distress.  Patient offered wheelchair from treatment area to hospital entrance, patient denies wheelchair.

## 2021-03-23 NOTE — ED Provider Notes (Signed)
ED Provider Notes by Corky CraftsBost, Corrado Hymon, MD at 03/23/21 1845                Author: Corky CraftsBost, Rosalena Mccorry, MD  Service: --  Author Type: Physician       Filed: 03/23/21 1952  Date of Service: 03/23/21 1845  Status: Signed          Editor: Corky CraftsBost, Shiri Hodapp, MD (Physician)               EMERGENCY DEPARTMENT HISTORY AND PHYSICAL EXAM           Date: 03/23/2021   Patient Name: Veronica Waller   Patient Age and Sex: 30 y.o.  female         History of Presenting Illness          Chief Complaint       Patient presents with        ?  Abdominal Pain     ?  Diarrhea        ?  Urinary Pain        History Provided By: Patient      HPI: Veronica Waller is a 30 year old  history of ovarian cyst presenting with lower abdominal pain and dysuria.  Patient reports of the past 1 to 2 weeks she has had burning dysuria as well as suprapubic pain with urination.  Reports urinary frequency of the same period time.  No foul-smelling  urine no hematuria.  She reports suprapubic abdominal pain, bilateral lower abdominal pain as well.  She denies vaginal discharge.  She is worried about a previously diagnosed (feels this may be causing her pain.  She reports today she developed onset  of loose stools, no blood, few episodes.      There are no other complaints, changes, or physical findings at this time.      PCP: None        No current facility-administered medications on file prior to encounter.          No current outpatient medications on file prior to encounter.             Past History        Past Medical History:     Past Medical History:        Diagnosis  Date         ?  Ovarian cyst             Past Surgical History:   History reviewed. No pertinent surgical history.    No prior abdominal surgeries      Family History:   History reviewed. No pertinent family history.      Social History:     Social History          Tobacco Use         ?  Smoking status:  Not on file     ?  Smokeless tobacco:  Not on file       Substance Use Topics         ?   Alcohol use:  Not on file         ?  Drug use:  Not on file           Allergies:   No Known Allergies        Review of Systems     Review of Systems    Constitutional: Negative for chills and fever.    HENT: Negative for congestion and rhinorrhea.  Respiratory: Negative for shortness of breath.     Cardiovascular: Negative for chest pain.    Gastrointestinal: Positive for abdominal pain and diarrhea . Negative for nausea and vomiting.    Genitourinary: Positive for dysuria and frequency .    Musculoskeletal: Negative for myalgias.    All other systems reviewed and are negative.           Physical Exam     Physical Exam   Vitals and nursing note reviewed.   Constitutional:        General: She is not in acute distress.     Appearance: Normal appearance. She is not ill-appearing.    HENT:       Head: Normocephalic.      Mouth/Throat:      Mouth: Mucous membranes are moist.    Eyes:       Conjunctiva/sclera: Conjunctivae normal.   Cardiovascular:       Rate and Rhythm: Normal rate and regular rhythm.      Pulses: Normal pulses.    Pulmonary:       Effort: Pulmonary effort is normal.      Breath sounds: Normal breath sounds.   Abdominal :      General: Abdomen is flat.      Palpations: Abdomen is soft.      Tenderness: There is abdominal tenderness in the  suprapubic area. There is no right CVA tenderness, left CVA tenderness, guarding or rebound. Negative signs include Murphy's sign and McBurney's sign.     Musculoskeletal:          General: No deformity.    Skin:      General: Skin is warm and dry.   Neurological :       Mental Status: She is alert and oriented to person, place, and time. Mental status is at baseline.    Psychiatric:         Behavior: Behavior normal.         Thought Content: Thought content normal.             Diagnostic Study Results     Labs     Recent Results (from the past 12 hour(s))     URINALYSIS W/ REFLEX CULTURE          Collection Time: 03/23/21  4:28 PM       Specimen: Urine          Result  Value  Ref Range            Color  YELLOW/STRAW          Appearance  CLEAR  CLEAR         Specific gravity  1.015  1.003 - 1.030         pH (UA)  7.0  5.0 - 8.0         Protein  Negative  NEG mg/dL       Glucose  Negative  NEG mg/dL       Ketone  TRACE (A)  NEG mg/dL       Bilirubin  Negative  NEG         Blood  Negative  NEG         Urobilinogen  0.2  0.2 - 1.0 EU/dL       Nitrites  Negative  NEG         Leukocyte Esterase  SMALL (A)  NEG         WBC  5-10  0 - 4 /hpf       RBC  0-5  0 - 5 /hpf       Epithelial cells  FEW  FEW /lpf       Bacteria  Negative  NEG /hpf       UA:UC IF INDICATED  CULTURE NOT INDICATED BY UA RESULT  CNI         WET PREP          Collection Time: 03/23/21  4:28 PM       Specimen: Miscellaneous sample         Result  Value  Ref Range            Clue cells  CLUE CELLS ABSENT          Wet prep  NO TRICHOMONAS SEEN          KOH, OTHER SOURCES          Collection Time: 03/23/21  4:28 PM       Specimen: Vagina; Other         Result  Value  Ref Range            Special Requests:  NO SPECIAL REQUESTS          KOH  NO YEAST SEEN          HCG URINE, QL. - POC          Collection Time: 03/23/21  4:34 PM         Result  Value  Ref Range            Pregnancy test,urine (POC)  Negative  NEG             Radiologic Studies -      US TRANSVAGINAL       Final Result      impression: Small right ovarian cyst.                 CT Results   (Last 48 hours)          None                 CXR Results   (Last 48 hours)          None                    Medical Decision Making     I am the first provider for this patient.      I reviewed the vital signs, available nursing notes, past medical history, past surgical history, family history and social history.      Vital Signs-Reviewed the patient's vital signs.   Patient Vitals for the past 12 hrs:           Temp  Pulse  Resp  SpO2           03/23/21 1440  98.8 ??F (37.1 ??C)  93  16  99 %           Records Reviewed: Nursing Notes and Old Medical Records       Provider Notes (Medical Decision Making):    Differential includes cystitis, pyelonephritis, ovarian pathology      Consider cervical infection, will send wet prep KOH.  Will test for pregnancy.  Consider symptomatic ovarian cyst versus ovarian torsion however pain is atypical for that.  If negative work-up will obtain transvaginal ultrasound to evaluate for ovarian  pathology.      ED Course:  Initial assessment performed. The patients presenting problems have been discussed, and they are in agreement with the care plan formulated and outlined with them.  I have encouraged them to ask questions as they arise throughout their visit.        ED Course as of 03/23/21 1911       Thu Mar 23, 2021        1629  Will send urine for UA wet prep KOH chlamydia.  No vaginal symptoms [WB]     1636  Pregnancy test negative [WB]     1739  No yeast clue cells or trichomonas visualized.  No evidence of UTI [WB]        1740  Will obtain transvaginal ultrasound to evaluate ovarian cyst [WB]        1909  Ultrasound demonstrates small right ovarian cyst, otherwise no abnormality normal flow [WB]              ED Course User Index   [WB] Corky Crafts, MD        Disposition:   Discharge Note:   The patient has been re-evaluated and is ready for discharge. Reviewed available results with patient. Counseled patient on diagnosis and care plan. Patient has expressed understanding, and all questions  have been answered. Patient agrees with plan and agrees to follow up as recommended, or to return to the ED if their symptoms worsen. Discharge instructions have been provided and explained to the patient, along with reasons to return to the ED.        PLAN:     Current Discharge Medication List              START taking these medications          Details        phenazopyridine (Pyridium) 200 mg tablet  Take 1 Tablet by mouth three (3) times daily for 2 days.   Qty: 6 Tablet, Refills:  0   Start date: 03/23/2021, End date:  03/25/2021                       2.      Follow-up Information      None             3.  Return to ED if worse         Diagnosis        Clinical Impression:       1.  Dysuria         2.  Cyst of right ovary            Attestations:      Corky Crafts, M.D.              Please note that this dictation was completed with Dragon, the computer voice recognition software.  Quite often unanticipated grammatical, syntax, homophones, and other interpretive errors are inadvertently transcribed by the computer software.  Please  disregard these errors.  Please excuse any errors that have escaped final proofreading.  Thank you.

## 2021-03-25 LAB — CHLAMYDIA / GC-AMPLIFIED
CHLAMYDIA TRACHOMATIS, NAA, 188078: NEGATIVE
Chlamydia trachomatis, NAA: NEGATIVE
NEISSERIA GONORRHOEAE, NAA, 188086: NEGATIVE
Neisseria gonorrhoeae, NAA: NEGATIVE

## 2021-03-31 ENCOUNTER — Inpatient Hospital Stay: Admit: 2021-03-31 | Discharge: 2021-03-31 | Discharge status: Against Medical Advice | Payer: MEDICAID

## 2021-03-31 DIAGNOSIS — M25579 Pain in unspecified ankle and joints of unspecified foot: Secondary | ICD-10-CM

## 2021-09-20 ENCOUNTER — Encounter

## 2021-09-28 ENCOUNTER — Inpatient Hospital Stay: Payer: MEDICAID

## 2021-09-28 ENCOUNTER — Ambulatory Visit: Payer: MEDICAID

## 2021-09-29 ENCOUNTER — Inpatient Hospital Stay: Admit: 2021-09-29 | Payer: MEDICAID

## 2021-09-29 DIAGNOSIS — R102 Pelvic and perineal pain: Secondary | ICD-10-CM

## 2022-04-02 ENCOUNTER — Emergency Department: Admit: 2022-04-03 | Payer: MEDICAID

## 2022-04-02 ENCOUNTER — Inpatient Hospital Stay
Admit: 2022-04-02 | Discharge: 2022-04-03 | Disposition: A | Discharge status: Home / Self Care | Payer: MEDICAID | Attending: Emergency Medicine

## 2022-04-02 DIAGNOSIS — K5901 Slow transit constipation: Secondary | ICD-10-CM

## 2022-04-02 LAB — METABOLIC PANEL, COMPREHENSIVE
A-G Ratio: 1.2 (ref 1.1–2.2)
ALT (SGPT): 12 U/L (ref 12–78)
AST (SGOT): 16 U/L (ref 15–37)
Albumin: 4.5 g/dL (ref 3.5–5.0)
Alk. phosphatase: 43 U/L — ABNORMAL LOW (ref 45–117)
Anion gap: 6 mmol/L (ref 5–15)
BUN/Creatinine ratio: 15 (ref 12–20)
BUN: 11 MG/DL (ref 6–20)
Bilirubin, total: 0.3 MG/DL (ref 0.2–1.0)
CO2: 29 mmol/L (ref 21–32)
Calcium: 9.6 MG/DL (ref 8.5–10.1)
Chloride: 104 mmol/L (ref 97–108)
Creatinine: 0.72 MG/DL (ref 0.55–1.02)
Globulin: 3.7 g/dL (ref 2.0–4.0)
Glucose: 115 mg/dL — ABNORMAL HIGH (ref 65–100)
Potassium: 3.6 mmol/L (ref 3.5–5.1)
Protein, total: 8.2 g/dL (ref 6.4–8.2)
Sodium: 139 mmol/L (ref 136–145)
eGFR: >60 mL/min/{1.73_m2} (ref >60)

## 2022-04-02 LAB — CBC WITH AUTOMATED DIFF
ABS. BASOPHILS: 0 10*3/uL (ref 0.0–0.1)
ABS. EOSINOPHILS: 0.2 10*3/uL (ref 0.0–0.4)
ABS. IMM. GRANS.: 0 10*3/uL (ref 0.00–0.04)
ABS. LYMPHOCYTES: 2.1 10*3/uL (ref 0.8–3.5)
ABS. MONOCYTES: 0.3 10*3/uL (ref 0.0–1.0)
ABS. NEUTROPHILS: 2.6 10*3/uL (ref 1.8–8.0)
ABSOLUTE NRBC: 0 10*3/uL (ref 0.00–0.01)
BASOPHILS: 1 % (ref 0–1)
EOSINOPHILS: 4 % (ref 0–7)
HCT: 39.6 % (ref 35.0–47.0)
HGB: 13.5 g/dL (ref 11.5–16.0)
IMMATURE GRANULOCYTES: 0 % (ref 0.0–0.5)
LYMPHOCYTES: 40 % (ref 12–49)
MCHC: 34.1 g/dL (ref 30.0–36.5)
MCV: 89.2 FL (ref 80.0–99.0)
MONOCYTES: 5 % (ref 5–13)
MPV: 11.2 FL (ref 8.9–12.9)
NEUTROPHILS: 50 % (ref 32–75)
NRBC: 0 PER 100 WBC
PLATELET: 171 10*3/uL (ref 150–400)
RBC: 4.44 M/uL (ref 3.80–5.20)
RDW: 12.3 % (ref 11.5–14.5)
WBC: 5.1 10*3/uL (ref 3.6–11.0)

## 2022-04-02 LAB — RETICULOCYTE COUNT
Absolute Retic Cnt.: 0.0606 M/ul (ref 0.0164–0.0776)
Absolute Retic Cnt.: 0.0606 M/ul (ref 0.0164–0.0776)
Retic Ct Pct: 1.4 % (ref 0.7–2.1)
Reticulocyte count: 1.4 % (ref 0.7–2.1)

## 2022-04-02 LAB — LIPASE
Lipase: 61 U/L — ABNORMAL LOW (ref 73–393)
Lipase: 61 U/L — ABNORMAL LOW (ref 73–393)

## 2022-04-02 LAB — COMPREHENSIVE METABOLIC PANEL
ALT: 12 U/L (ref 12–78)
AST: 16 U/L (ref 15–37)
Albumin/Globulin Ratio: 1.2 (ref 1.1–2.2)
Albumin: 4.5 g/dL (ref 3.5–5.0)
Alkaline Phosphatase: 43 U/L — ABNORMAL LOW (ref 45–117)
Anion Gap: 6 mmol/L (ref 5–15)
BUN: 11 MG/DL (ref 6–20)
Bun/Cre Ratio: 15 (ref 12–20)
CO2: 29 mmol/L (ref 21–32)
Calcium: 9.6 MG/DL (ref 8.5–10.1)
Chloride: 104 mmol/L (ref 97–108)
Creatinine: 0.72 MG/DL (ref 0.55–1.02)
ESTIMATED GLOMERULAR FILTRATION RATE: >60 mL/min/{1.73_m2} (ref >60)
Globulin: 3.7 g/dL (ref 2.0–4.0)
Glucose: 115 mg/dL — ABNORMAL HIGH (ref 65–100)
Potassium: 3.6 mmol/L (ref 3.5–5.1)
Sodium: 139 mmol/L (ref 136–145)
Total Bilirubin: 0.3 MG/DL (ref 0.2–1.0)
Total Protein: 8.2 g/dL (ref 6.4–8.2)

## 2022-04-02 LAB — CBC WITH AUTO DIFFERENTIAL
Basophils %: 1 % (ref 0–1)
Basophils Absolute: 0 10*3/uL (ref 0.0–0.1)
Eosinophils %: 4 % (ref 0–7)
Eosinophils Absolute: 0.2 10*3/uL (ref 0.0–0.4)
Granulocyte Absolute Count: 0 10*3/uL (ref 0.00–0.04)
Hematocrit: 39.6 % (ref 35.0–47.0)
Hemoglobin: 13.5 g/dL (ref 11.5–16.0)
Immature Granulocytes: 0 % (ref 0.0–0.5)
Lymphocytes %: 40 % (ref 12–49)
Lymphocytes Absolute: 2.1 10*3/uL (ref 0.8–3.5)
MCH: 30.4 PG (ref 26.0–34.0)
MCHC: 34.1 g/dL (ref 30.0–36.5)
MCV: 89.2 FL (ref 80.0–99.0)
MPV: 11.2 FL (ref 8.9–12.9)
Monocytes %: 5 % (ref 5–13)
Monocytes Absolute: 0.3 10*3/uL (ref 0.0–1.0)
NRBC Absolute: 0 10*3/uL (ref 0.00–0.01)
Neutrophils %: 50 % (ref 32–75)
Neutrophils Absolute: 2.6 10*3/uL (ref 1.8–8.0)
Nucleated RBCs: 0 PER 100 WBC
Platelets: 171 10*3/uL (ref 150–400)
RBC: 4.44 M/uL (ref 3.80–5.20)
RDW: 12.3 % (ref 11.5–14.5)
WBC: 5.1 10*3/uL (ref 3.6–11.0)

## 2022-04-02 MED ORDER — MORPHINE 4 MG/ML INTRAVENOUS SOLUTION
4 mg/mL | Freq: Once | INTRAVENOUS | Status: AC
Start: 2022-04-02 — End: 2022-04-02
  Administered 2022-04-03: via INTRAVENOUS

## 2022-04-02 MED ORDER — SODIUM CHLORIDE 0.9 % IV
Freq: Once | INTRAVENOUS | Status: AC
Start: 2022-04-02 — End: 2022-04-02
  Administered 2022-04-02: 22:00:00 via INTRAVENOUS

## 2022-04-02 MED ORDER — KETOROLAC TROMETHAMINE 30 MG/ML INJECTION
30 mg/mL (1 mL) | INTRAMUSCULAR | Status: AC
Start: 2022-04-02 — End: 2022-04-02
  Administered 2022-04-02: 22:00:00 via INTRAVENOUS

## 2022-04-02 MED FILL — KETOROLAC TROMETHAMINE 30 MG/ML INJECTION: 30 mg/mL (1 mL) | INTRAMUSCULAR | Qty: 1

## 2022-04-02 MED FILL — SODIUM CHLORIDE 0.9 % IV: INTRAVENOUS | Qty: 1000

## 2022-04-02 NOTE — Progress Notes (Signed)
Rx keflex

## 2022-04-02 NOTE — ED Notes (Signed)
Patient has been instructed that they have been given Morphine* which contains opioids, benzodiazepines, or other sedating drugs. Patient is aware that they  will need to refrain from driving or operating heavy machinery after taking this medication.  Patient also instructed that they need to avoid drinking alcohol and using other products containing opioids, benzodiazepines, or other sedating drugs.  Patient verbalized understanding.

## 2022-04-02 NOTE — ED Notes (Signed)
Patient (s) was given copy of dc instructions and 0 paper script(s) and 3 electronic scripts.  Patient (s)  verbalized understanding of instructions and script (s).  Patient given a current medication reconciliation form and verbalized understanding of their medications.  Patient (s) verbalized understanding of the importance of discussing medications with  his or her physician or clinic they will be following up with.  Patient alert and oriented and in no acute distress.      Pt left with designated driver.

## 2022-04-02 NOTE — ED Notes (Signed)
ED Notes  by Reginia Naas, RN at 04/02/22 1808                Author: Reginia Naas, RN  Service: --  Author Type: Care Management       Filed: 04/02/22 1809  Date of Service: 04/02/22 1808  Status: Signed          Editor: Reginia Naas, RN (Care Management)               Pt arrived to ED via POV with c/o ABD pain and constipation. Reports a sickle cell crisis./ feels like normal crisis. Abd diffusely tender to touch. BS positive. CO 10/10 ABD pain  Pt is in no acute distress. Will continue to monitor. See nursing assessment. Safety precautions in place; call light within reach.        Emergency Department Nursing Plan of Care           The Nursing Plan of Care is developed from the Nursing assessment and Emergency Department Attending provider initial evaluation.  The plan of care may be reviewed in the  ED Provider note.      The Plan of Care was developed with the following considerations:    Patient / Family readiness to learn indicated OA:[id number] understanding   Persons(s) to be included in education: patient   Barriers to Learning/Limitations:No        Signed        Joice Lofts Princess Perna, RN     04/02/2022   6:08 PM

## 2022-04-02 NOTE — Progress Notes (Signed)
Waiting on Labs, Pregnancy Test Results, and IV Placement for CT W Contrast exam

## 2022-04-02 NOTE — ED Notes (Signed)
Bedside report given to [employer]  (oncoming  nure). Report included the following information SBAR, KARDEX, ED Summary, MAR, & recent results. Transferred care at this time

## 2022-04-02 NOTE — ED Provider Notes (Signed)
ED Provider Notes by Lowella Petties, PA at 04/02/22 1911                Author: Lowella Petties, [place]  Service: Emergency Medicine  Author Type: Physician Assistant       Filed: 04/04/22 2105  Date of Service: 04/02/22 1911  Status: Attested           Editor: Lowella Petties, [place] (Physician Assistant)  Cosigner: Azalia Bilis, MD at 04/06/22 [phone]          Attestation signed by Azalia Bilis, MD at 04/06/22 [phone]          This patient was seen, evaluated, treated and dispositioned independently by the APP. I was immediately available for consultation in the Emergency Department.      Talbert Nan, MD                                 [facility] EMERGENCY DEPT  EMERGENCY DEPARTMENT ENCOUNTER            Pt Name: Veronica Waller   MRN: [id number]   Vici 1991-09-18   Date of evaluation: 04/02/2022   Provider: Lowella Petties, PA    PCP: None   Note Started: 7:11 PM 04/02/22         CHIEF COMPLAINT            Chief Complaint       Patient presents with        ?  Sickle Cell Crisis     ?  Abdominal Pain             Pt reports abd pain x1 day, and constipation for 3 days. Pt states she has sickle cell and this pain feels similar to when she has a crisis.               HISTORY OF PRESENT ILLNESS: 1 or more elements         History From: Patient   HPI Limitations : None       Veronica Waller is a 31 y.o. female who presents with abdominal pain.  Patient reports diffuse abdominal pain and constipation for 3 days.  She reports a history of sickle cell and says she has had similar abdominal pain in the past.  She denies fever,  vomiting, dysuria, or vaginal discharge.       Nursing Notes were all reviewed and agreed with or any disagreements were addressed in the HPI.         REVIEW OF SYSTEMS         Review of Systems       Positives and Pertinent negatives as per HPI.        PAST HISTORY        Past Medical History:     Past Medical History:        Diagnosis  Date         ?  Ovarian cyst             Past Surgical  History:   History reviewed. No pertinent surgical history.      Family History:   History reviewed. No pertinent family history.      Social History:     Social History          Tobacco Use         ?  Smoking status:  Every  Day              Packs/day:  1.00         Years:  2.00         Pack years:  2.00         Types:  Cigarettes         ?  Smokeless tobacco:  Never       Substance Use Topics         ?  Drug use:  Never           Allergies:   No Known Allergies        CURRENT MEDICATIONS           Discharge Medication List as of 04/02/2022 10:48 PM                    PHYSICAL EXAM           ED Triage Vitals [04/02/22 1738]     ED Encounter Vitals Group           BP  114/72        Pulse (Heart Rate)  95        Resp Rate  20        Temp  98 F (36.7 C)        Temp src          O2 Sat (%)  99 %        Weight  115 lb           Height  '5\' 4"'$             Physical Exam   Vitals and nursing note reviewed.    Constitutional:        General: She is not in acute distress.    Cardiovascular:       Rate and Rhythm: Normal rate and regular rhythm.       Heart sounds: No murmur heard.   Pulmonary:       Effort: Pulmonary effort is normal. No respiratory distress.       Breath sounds: Normal breath sounds.     Abdominal:       Comments: Abdomen is soft.  Mild diffuse tenderness to palpation.  No rebound or guarding.     Neurological:       Mental Status: She is alert.             DIAGNOSTIC RESULTS     LABS:         Recent Results (from the past 12 hour(s))     CBC WITH AUTOMATED DIFF          Collection Time: 04/02/22  6:10 PM         Result  Value  Ref Range            WBC  5.1  3.6 - 11.0 K/uL       RBC  4.44  3.80 - 5.20 M/uL       HGB  13.5  11.5 - 16.0 g/dL       HCT  39.6  35.0 - 47.0 %       MCV  89.2  80.0 - 99.0 FL       MCH  30.4  26.0 - 34.0 PG       MCHC  34.1  30.0 - 36.5 g/dL       RDW  12.3  11.5 - 14.5 %  PLATELET  171  150 - 400 K/uL       MPV  11.2  8.9 - 12.9 FL       NRBC  0.0  0 PER 100 WBC       ABSOLUTE  NRBC  0.00  0.00 - 0.01 K/uL       NEUTROPHILS  50  32 - 75 %       LYMPHOCYTES  40  12 - 49 %       MONOCYTES  5  5 - 13 %       EOSINOPHILS  4  0 - 7 %       BASOPHILS  1  0 - 1 %       IMMATURE GRANULOCYTES  0  0.0 - 0.5 %       ABS. NEUTROPHILS  2.6  1.8 - 8.0 K/UL       ABS. LYMPHOCYTES  2.1  0.8 - 3.5 K/UL            ABS. MONOCYTES  0.3  0.0 - 1.0 K/UL            ABS. EOSINOPHILS  0.2  0.0 - 0.4 K/UL       ABS. BASOPHILS  0.0  0.0 - 0.1 K/UL       ABS. IMM. GRANS.  0.0  0.00 - 0.04 K/UL       DF  AUTOMATED          METABOLIC PANEL, COMPREHENSIVE          Collection Time: 04/02/22  6:10 PM         Result  Value  Ref Range            Sodium  139  136 - 145 mmol/L       Potassium  3.6  3.5 - 5.1 mmol/L       Chloride  104  97 - 108 mmol/L       CO2  29  21 - 32 mmol/L       Anion gap  6  5 - 15 mmol/L       Glucose  115 (H)  65 - 100 mg/dL       BUN  11  6 - 20 MG/DL       Creatinine  0.72  0.55 - 1.02 MG/DL       BUN/Creatinine ratio  15  12 - 20         eGFR  >60  >60 ml/min/1.77m       Calcium  9.6  8.5 - 10.1 MG/DL       Bilirubin, total  0.3  0.2 - 1.0 MG/DL       ALT (SGPT)  12  12 - 78 U/L       AST (SGOT)  16  15 - 37 U/L       Alk. phosphatase  43 (L)  45 - 117 U/L       Protein, total  8.2  6.4 - 8.2 g/dL       Albumin  4.5  3.5 - 5.0 g/dL       Globulin  3.7  2.0 - 4.0 g/dL       A-G Ratio  1.2  1.1 - 2.2         LIPASE          Collection Time: 04/02/22  6:10 PM         Result  Value  Ref Range            Lipase  61 (L)  73 - 393 U/L       RETICULOCYTE COUNT          Collection Time: 04/02/22  6:10 PM         Result  Value  Ref Range            Reticulocyte count  1.4  0.7 - 2.1 %       Absolute Retic Cnt.  0.0606  0.0164 - 0.0776 M/ul       URINALYSIS W/ REFLEX CULTURE          Collection Time: 04/02/22  8:00 PM       Specimen: Urine         Result  Value  Ref Range            Color  YELLOW/STRAW          Appearance  CLEAR  CLEAR         Specific gravity  1.010          pH (UA)  7.0  5.0 - 8.0          Protein  Negative  NEG mg/dL       Glucose  Negative  NEG mg/dL       Ketone  Negative  NEG mg/dL       Bilirubin  Negative  NEG         Blood  Negative  NEG         Urobilinogen  0.2  0.2 - 1.0 EU/dL       Nitrites  Negative  NEG         Leukocyte Esterase  LARGE (A)  NEG         WBC  10-20  0 - 4 /hpf       RBC  0-5  0 - 5 /hpf       Epithelial cells  FEW  FEW /lpf       Bacteria  1+ (A)  NEG /hpf       UA:UC IF INDICATED  URINE CULTURE ORDERED (A)  CNI         HCG URINE, QL. - POC          Collection Time: 04/02/22  8:03 PM         Result  Value  Ref Range            Pregnancy test,urine (POC)  Negative  NEG              EKG: When ordered, EKG's are interpreted by the Emergency Department Physician in the  absence of a cardiologist.  Please see their note for interpretation of EKG.        RADIOLOGY:   Non-plain film images such as CT, Ultrasound and MRI are read by the radiologist. Plain radiographic images are visualized and preliminarily interpreted by the ED Provider with the below findings:              Interpretation per the Radiologist below, if available at the time of this note:       CT ABD PELV W CONT      Result Date: 04/02/2022   EXAM: CT ABD PELV W CONT INDICATION: diffuse abdominal pain, worse in the RLQ, with constipation COMPARISON: None CONTRAST: 100 mL of Isovue-370. ORAL CONTRAST: None TECHNIQUE: Following the uneventful intravenous administration of contrast, thin  axial  images were obtained through the abdomen and pelvis. Coronal and sagittal reconstructions were generated. CT dose reduction was achieved through use of a standardized protocol tailored for this examination and automatic exposure control for dose modulation.  FINDINGS: LOWER THORAX: No significant abnormality in the incidentally imaged lower chest. LIVER: No mass. BILIARY TREE: Status post cholecystectomy. CBD is not dilated. SPLEEN: within normal limits. PANCREAS: No mass or ductal dilatation. ADRENALS: Unremarkable.   KIDNEYS: No mass, calculus, or hydronephrosis. STOMACH: Unremarkable. SMALL BOWEL: No dilatation or wall thickening. COLON: No dilatation or wall thickening. Moderate fecal stasis. APPENDIX: Not visualized, no inflammatory changes are seen in the right  lower quadrant. PERITONEUM: No ascites or pneumoperitoneum. RETROPERITONEUM: No lymphadenopathy or aortic aneurysm. REPRODUCTIVE ORGANS: Unremarkable. URINARY BLADDER: No mass or calculus. BONES: No destructive bone lesion. ABDOMINAL WALL: No mass or  hernia. ADDITIONAL COMMENTS: N/A       No acute abnormality is identified. Moderate fecal stasis.           PROCEDURES     Unless otherwise noted below, none   Procedures         CRITICAL CARE TIME             EMERGENCY DEPARTMENT COURSE and DIFFERENTIAL DIAGNOSIS/MDM     Vitals:       Vitals:           04/02/22 1738  04/02/22 1805         BP:  114/72       Pulse:  95       Resp:  20       Temp:  98 F (36.7 C)       SpO2:  99%  99%     Weight:  52.2 kg (115 lb)           Height:  '5\' 4"'$  (1.626 m)              Patient was given the following medications:     Medications       ketorolac (TORADOL) injection 15 mg (15 mg IntraVENous Given 04/02/22 1824)     0.9% sodium chloride infusion 1,000 mL (0 mL IntraVENous IV Completed 04/02/22 2211)     morphine injection 4 mg (4 mg IntraVENous Given 04/02/22 2010)       iopamidoL (ISOVUE-370) 370 mg iodine /mL (76 %) injection 100 mL (100 mL IntraVENous Given 04/02/22 2032)           CONSULTS: (Who and What was discussed)   None      Chronic Conditions: Sickle cell      Social Determinants affecting Dx or Tx: None      Records Reviewed (source and summary of external records): Prior medical records      CC/HPI Summary, DDx, ED Course, and Reassessment: This is a 31 year old female who presents with diffuse abdominal pain and constipation for 3  days.  She denies fever, vomiting, dysuria, or vaginal discharge.  She reports that she has a history of sickle cell anemia and has had  abdominal pain like this in the past.  Differential diagnosis includes constipation, bowel obstruction, sickle cell  crisis, pancreatitis, cholelithiasis, ovarian cyst, appendicitis.      Patient is afebrile with stable vital signs.  She has diffuse abdominal tenderness without peritoneal exam.       CBC shows no leukocytosis or anemia.  No elevation of reticulocyte count to suggest active sickle cell disease.  No elevation of LFTs or  lipase.  Urinalysis is consistent with UTI.  CT abdomen and pelvis shows constipation without acute process.      Patient was treated with IV fluids and analgesia and felt improved on recheck.  She was given an enema and was able to have a bowel movement.  We will continue bowel regimen at home with MiraLAX and prescribe antibiotic for UTI.  Advise follow-up with  PCP for recheck and discussed return precautions.             Disposition Considerations (Tests not done, Shared Decision Making, Pt Expectation of Test or Tx.):           FINAL IMPRESSION           1.  Slow transit constipation         2.  Acute cystitis without hematuria                DISPOSITION/PLAN     Discharged      Discharge Note: The patient is stable for discharge home. The signs, symptoms, diagnosis, and discharge instructions have been discussed, understanding conveyed, and agreed upon. The patient is to follow up as recommended or return to ER should their  symptoms worsen.        PATIENT REFERRED TO:     Follow-up Information                  Follow up With  Specialties  Details  Why  Contact Info              Your primary care doctor    Schedule an appointment as soon as possible for a visit in 1 week                  [facility] EMERGENCY DEPT  Emergency Medicine  Go to   If symptoms worsen  [address]:     Discharge Medication List as of 04/02/2022 10:48 PM                 START taking these medications          Details         polyethylene glycol (Miralax) 17 gram/dose powder  Take 17 g by mouth daily. 1 tablespoon with 8 oz of water daily, Normal, Disp-507 g, R-0               glycerin, adult, suppository  Insert 1 Suppository into rectum now for 1 dose., Normal, Disp-1 Suppository, R-0               cephALEXin (Keflex) 500 mg capsule  Take 1 Capsule by mouth two (2) times a day for 7 days., Normal, Disp-14 Capsule, R-0                           DISCONTINUED MEDICATIONS:     Discharge Medication List as of 04/02/2022 10:48 PM                  ED Attending Involvement : I have seen and evaluated the patient. My supervision physician was available for consultation.       I am the Primary Clinician of Record.    Lowella Petties, PA (electronically signed)      (  Please note that parts of this dictation were completed with voice recognition software. Quite often unanticipated grammatical, syntax, homophones, and other interpretive errors are inadvertently transcribed by the computer software. Please disregards  these errors. Please excuse any errors that have escaped final proofreading.)

## 2022-04-02 NOTE — ED Notes (Signed)
Pt heading to CT

## 2022-04-03 LAB — HCG URINE, QL. - POC
HCG, Pregnancy, Urine, POC: NEGATIVE
Pregnancy test,urine (POC): NEGATIVE

## 2022-04-03 LAB — CULTURE, URINE

## 2022-04-03 LAB — URINALYSIS W/ REFLEX CULTURE
Bilirubin, Urine: NEGATIVE
Bilirubin: NEGATIVE
Blood, Urine: NEGATIVE
Blood: NEGATIVE
Glucose, Ur: NEGATIVE mg/dL
Ketone: NEGATIVE mg/dL
Ketones, Urine: NEGATIVE mg/dL
Nitrite, Urine: NEGATIVE
Nitrites: NEGATIVE
Protein, UA: NEGATIVE mg/dL
Protein: NEGATIVE mg/dL
Specific Gravity, UA: 1.01
Specific gravity: 1.01
Urobilinogen, UA, POCT: 0.2 EU/dL (ref 0.2–1.0)
Urobilinogen: 0.2 EU/dL (ref 0.2–1.0)
pH (UA): 7 (ref 5.0–8.0)
pH, UA: 7 (ref 5.0–8.0)

## 2022-04-03 MED ORDER — POLYETHYLENE GLYCOL 3350 100 % ORAL POWDER
17 gram/dose | Freq: Every day | ORAL | 0 refills | Status: AC
Start: 2022-04-03 — End: ?

## 2022-04-03 MED ORDER — CEPHALEXIN 500 MG CAP
500 mg | ORAL_CAPSULE | Freq: Two times a day (BID) | ORAL | 0 refills | Status: AC
Start: 2022-04-03 — End: 2022-04-09

## 2022-04-03 MED ORDER — GLYCERIN (ADULT) RECTAL SUPPOSITORY
RECTAL | 0 refills | Status: AC
Start: 2022-04-03 — End: 2022-04-02

## 2022-04-03 MED ORDER — IOPAMIDOL 76 % IV SOLN
370 mg iodine /mL (76 %) | Freq: Once | INTRAVENOUS | Status: AC
Start: 2022-04-03 — End: 2022-04-02
  Administered 2022-04-03: 01:00:00 via INTRAVENOUS

## 2022-04-03 MED FILL — ISOVUE-370  76 % INTRAVENOUS SOLUTION: 370 mg iodine /mL (76 %) | INTRAVENOUS | Qty: 100

## 2022-04-03 MED FILL — MORPHINE 4 MG/ML SYRINGE: 4 mg/mL | INTRAMUSCULAR | Qty: 1

## 2022-04-04 LAB — CULTURE, URINE: Colonies Counted: >100000

## 2022-05-29 ENCOUNTER — Inpatient Hospital Stay: Admit: 2022-05-29 | Discharge: 2022-05-29 | Payer: MEDICAID

## 2022-05-29 DIAGNOSIS — Z5329 Procedure and treatment not carried out because of patient's decision for other reasons: Secondary | ICD-10-CM

## 2022-05-29 NOTE — ED Notes (Signed)
No answer when called to treatment area     Florene Route, RN  05/29/22 1829

## 2022-05-29 NOTE — ED Notes (Signed)
No answer when called to treatment area     Florene Route, RN  05/29/22 1813

## 2022-05-29 NOTE — ED Notes (Signed)
No answer when called to treatment area     Florene Route, RN  05/29/22 1838
